# Patient Record
Sex: Female | Born: 1965 | Race: Black or African American | Hispanic: No | Marital: Married | Smoking: Former smoker
Health system: Southern US, Community
[De-identification: ages and names within clinical notes are randomized; demographics above are authoritative.]

## PROBLEM LIST (undated history)

## (undated) DIAGNOSIS — I639 Cerebral infarction, unspecified: Secondary | ICD-10-CM

## (undated) DIAGNOSIS — E785 Hyperlipidemia, unspecified: Secondary | ICD-10-CM

## (undated) DIAGNOSIS — G43919 Migraine, unspecified, intractable, without status migrainosus: Secondary | ICD-10-CM

## (undated) DIAGNOSIS — C8190 Hodgkin lymphoma, unspecified, unspecified site: Secondary | ICD-10-CM

## (undated) DIAGNOSIS — I69354 Hemiplegia and hemiparesis following cerebral infarction affecting left non-dominant side: Secondary | ICD-10-CM

## (undated) DIAGNOSIS — E119 Type 2 diabetes mellitus without complications: Secondary | ICD-10-CM

## (undated) DIAGNOSIS — H539 Unspecified visual disturbance: Secondary | ICD-10-CM

## (undated) DIAGNOSIS — E559 Vitamin D deficiency, unspecified: Secondary | ICD-10-CM

## (undated) DIAGNOSIS — M62838 Other muscle spasm: Secondary | ICD-10-CM

## (undated) DIAGNOSIS — I10 Essential (primary) hypertension: Secondary | ICD-10-CM

## (undated) DIAGNOSIS — K219 Gastro-esophageal reflux disease without esophagitis: Secondary | ICD-10-CM

## (undated) DIAGNOSIS — H548 Legal blindness, as defined in USA: Secondary | ICD-10-CM

## (undated) DIAGNOSIS — J45909 Unspecified asthma, uncomplicated: Secondary | ICD-10-CM

## (undated) DIAGNOSIS — L930 Discoid lupus erythematosus: Secondary | ICD-10-CM

## (undated) HISTORY — DX: Vitamin D deficiency, unspecified: E55.9

## (undated) HISTORY — DX: Hodgkin lymphoma, unspecified, unspecified site: C81.90

## (undated) HISTORY — DX: Unspecified visual disturbance: H53.9

## (undated) HISTORY — DX: Migraine, unspecified, intractable, without status migrainosus: G43.919

## (undated) HISTORY — DX: Discoid lupus erythematosus: L93.0

## (undated) HISTORY — DX: Legal blindness, as defined in USA: H54.8

## (undated) HISTORY — PX: MASTECTOMY: SHX3

## (undated) HISTORY — DX: Hemiplegia and hemiparesis following cerebral infarction affecting left non-dominant side: I69.354

## (undated) HISTORY — DX: Other muscle spasm: M62.838

## (undated) HISTORY — DX: Essential (primary) hypertension: I10

## (undated) HISTORY — DX: Cerebral infarction, unspecified: I63.9

## (undated) HISTORY — PX: ABDOMINAL HYSTERECTOMY: SHX81

## (undated) HISTORY — DX: Gastro-esophageal reflux disease without esophagitis: K21.9

## (undated) HISTORY — DX: Hyperlipidemia, unspecified: E78.5

---

## 1999-10-09 ENCOUNTER — Other Ambulatory Visit: Admission: RE | Admit: 1999-10-09 | Discharge: 1999-10-09 | Payer: Self-pay | Admitting: Ophthalmology

## 1999-10-15 ENCOUNTER — Encounter: Admission: RE | Admit: 1999-10-15 | Discharge: 1999-10-15 | Payer: Self-pay | Admitting: Family Medicine

## 1999-10-15 ENCOUNTER — Encounter: Payer: Self-pay | Admitting: Family Medicine

## 1999-11-05 ENCOUNTER — Encounter (HOSPITAL_BASED_OUTPATIENT_CLINIC_OR_DEPARTMENT_OTHER): Payer: Self-pay | Admitting: General Surgery

## 1999-11-07 ENCOUNTER — Inpatient Hospital Stay (HOSPITAL_COMMUNITY): Admission: RE | Admit: 1999-11-07 | Discharge: 1999-11-10 | Payer: Self-pay | Admitting: General Surgery

## 1999-11-07 ENCOUNTER — Encounter (HOSPITAL_BASED_OUTPATIENT_CLINIC_OR_DEPARTMENT_OTHER): Payer: Self-pay | Admitting: General Surgery

## 1999-11-07 ENCOUNTER — Encounter (INDEPENDENT_AMBULATORY_CARE_PROVIDER_SITE_OTHER): Payer: Self-pay

## 1999-11-09 ENCOUNTER — Encounter: Payer: Self-pay | Admitting: Pediatrics

## 2000-02-04 ENCOUNTER — Emergency Department (HOSPITAL_COMMUNITY): Admission: EM | Admit: 2000-02-04 | Discharge: 2000-02-05 | Payer: Self-pay | Admitting: *Deleted

## 2000-02-21 ENCOUNTER — Emergency Department (HOSPITAL_COMMUNITY): Admission: EM | Admit: 2000-02-21 | Discharge: 2000-02-21 | Payer: Self-pay | Admitting: Emergency Medicine

## 2000-02-22 ENCOUNTER — Encounter: Payer: Self-pay | Admitting: Emergency Medicine

## 2000-02-23 ENCOUNTER — Emergency Department (HOSPITAL_COMMUNITY): Admission: EM | Admit: 2000-02-23 | Discharge: 2000-02-23 | Payer: Self-pay | Admitting: Emergency Medicine

## 2000-02-25 ENCOUNTER — Encounter: Admission: RE | Admit: 2000-02-25 | Discharge: 2000-02-25 | Payer: Self-pay | Admitting: Psychiatry

## 2000-03-26 ENCOUNTER — Ambulatory Visit (HOSPITAL_COMMUNITY): Admission: RE | Admit: 2000-03-26 | Discharge: 2000-03-26 | Payer: Self-pay | Admitting: Family Medicine

## 2000-03-26 ENCOUNTER — Encounter: Payer: Self-pay | Admitting: Family Medicine

## 2000-05-18 ENCOUNTER — Encounter: Admission: RE | Admit: 2000-05-18 | Discharge: 2000-05-18 | Payer: Self-pay | Admitting: Internal Medicine

## 2000-05-22 ENCOUNTER — Emergency Department (HOSPITAL_COMMUNITY): Admission: EM | Admit: 2000-05-22 | Discharge: 2000-05-22 | Payer: Self-pay | Admitting: Emergency Medicine

## 2000-06-21 ENCOUNTER — Ambulatory Visit (HOSPITAL_COMMUNITY): Admission: RE | Admit: 2000-06-21 | Discharge: 2000-06-21 | Payer: Self-pay | Admitting: Internal Medicine

## 2000-06-21 ENCOUNTER — Encounter: Admission: RE | Admit: 2000-06-21 | Discharge: 2000-06-21 | Payer: Self-pay | Admitting: Internal Medicine

## 2000-06-29 ENCOUNTER — Ambulatory Visit (HOSPITAL_COMMUNITY): Admission: RE | Admit: 2000-06-29 | Discharge: 2000-06-29 | Payer: Self-pay | Admitting: *Deleted

## 2000-06-29 ENCOUNTER — Encounter: Payer: Self-pay | Admitting: *Deleted

## 2000-06-30 ENCOUNTER — Ambulatory Visit (HOSPITAL_COMMUNITY): Admission: RE | Admit: 2000-06-30 | Discharge: 2000-06-30 | Payer: Self-pay | Admitting: Internal Medicine

## 2000-06-30 ENCOUNTER — Encounter: Admission: RE | Admit: 2000-06-30 | Discharge: 2000-06-30 | Payer: Self-pay | Admitting: Internal Medicine

## 2000-07-23 ENCOUNTER — Encounter: Admission: RE | Admit: 2000-07-23 | Discharge: 2000-07-23 | Payer: Self-pay

## 2000-08-02 ENCOUNTER — Encounter: Admission: RE | Admit: 2000-08-02 | Discharge: 2000-10-31 | Payer: Self-pay | Admitting: *Deleted

## 2000-09-07 ENCOUNTER — Emergency Department (HOSPITAL_COMMUNITY): Admission: EM | Admit: 2000-09-07 | Discharge: 2000-09-08 | Payer: Self-pay | Admitting: Emergency Medicine

## 2000-09-07 ENCOUNTER — Encounter: Payer: Self-pay | Admitting: Emergency Medicine

## 2000-09-10 ENCOUNTER — Encounter: Payer: Self-pay | Admitting: Emergency Medicine

## 2000-09-10 ENCOUNTER — Emergency Department (HOSPITAL_COMMUNITY): Admission: EM | Admit: 2000-09-10 | Discharge: 2000-09-10 | Payer: Self-pay | Admitting: Emergency Medicine

## 2000-09-13 ENCOUNTER — Encounter: Admission: RE | Admit: 2000-09-13 | Discharge: 2000-09-13 | Payer: Self-pay | Admitting: Internal Medicine

## 2000-09-21 ENCOUNTER — Encounter: Admission: RE | Admit: 2000-09-21 | Discharge: 2000-09-21 | Payer: Self-pay | Admitting: Hematology and Oncology

## 2000-10-01 ENCOUNTER — Encounter: Payer: Self-pay | Admitting: *Deleted

## 2000-10-01 ENCOUNTER — Ambulatory Visit (HOSPITAL_COMMUNITY): Admission: RE | Admit: 2000-10-01 | Discharge: 2000-10-01 | Payer: Self-pay | Admitting: *Deleted

## 2001-07-21 ENCOUNTER — Encounter: Admission: RE | Admit: 2001-07-21 | Discharge: 2001-07-21 | Payer: Self-pay | Admitting: Internal Medicine

## 2001-08-03 ENCOUNTER — Encounter (INDEPENDENT_AMBULATORY_CARE_PROVIDER_SITE_OTHER): Payer: Self-pay | Admitting: *Deleted

## 2001-08-03 ENCOUNTER — Other Ambulatory Visit: Admission: RE | Admit: 2001-08-03 | Discharge: 2001-08-03 | Payer: Self-pay | Admitting: *Deleted

## 2001-08-03 ENCOUNTER — Encounter: Admission: RE | Admit: 2001-08-03 | Discharge: 2001-08-03 | Payer: Self-pay | Admitting: *Deleted

## 2001-08-18 ENCOUNTER — Encounter: Admission: RE | Admit: 2001-08-18 | Discharge: 2001-08-18 | Payer: Self-pay | Admitting: *Deleted

## 2001-08-18 ENCOUNTER — Encounter (INDEPENDENT_AMBULATORY_CARE_PROVIDER_SITE_OTHER): Payer: Self-pay | Admitting: *Deleted

## 2001-08-19 ENCOUNTER — Inpatient Hospital Stay (HOSPITAL_COMMUNITY): Admission: RE | Admit: 2001-08-19 | Discharge: 2001-08-20 | Payer: Self-pay | Admitting: *Deleted

## 2001-08-25 ENCOUNTER — Emergency Department (HOSPITAL_COMMUNITY): Admission: EM | Admit: 2001-08-25 | Discharge: 2001-08-25 | Payer: Self-pay | Admitting: Emergency Medicine

## 2001-09-05 ENCOUNTER — Emergency Department (HOSPITAL_COMMUNITY): Admission: EM | Admit: 2001-09-05 | Discharge: 2001-09-05 | Payer: Self-pay | Admitting: Emergency Medicine

## 2001-09-07 ENCOUNTER — Encounter (INDEPENDENT_AMBULATORY_CARE_PROVIDER_SITE_OTHER): Payer: Self-pay | Admitting: *Deleted

## 2001-09-07 ENCOUNTER — Observation Stay (HOSPITAL_COMMUNITY): Admission: AD | Admit: 2001-09-07 | Discharge: 2001-09-08 | Payer: Self-pay | Admitting: *Deleted

## 2001-09-16 ENCOUNTER — Inpatient Hospital Stay (HOSPITAL_COMMUNITY): Admission: AD | Admit: 2001-09-16 | Discharge: 2001-10-07 | Payer: Self-pay | Admitting: Surgery

## 2001-09-28 ENCOUNTER — Encounter: Payer: Self-pay | Admitting: Infectious Diseases

## 2001-10-25 ENCOUNTER — Ambulatory Visit (HOSPITAL_COMMUNITY): Admission: RE | Admit: 2001-10-25 | Discharge: 2001-10-25 | Payer: Self-pay | Admitting: *Deleted

## 2003-09-14 ENCOUNTER — Other Ambulatory Visit: Admission: RE | Admit: 2003-09-14 | Discharge: 2003-09-14 | Payer: Self-pay | Admitting: Family Medicine

## 2004-09-01 ENCOUNTER — Inpatient Hospital Stay (HOSPITAL_COMMUNITY): Admission: EM | Admit: 2004-09-01 | Discharge: 2004-09-05 | Payer: Self-pay | Admitting: Emergency Medicine

## 2004-09-09 ENCOUNTER — Emergency Department (HOSPITAL_COMMUNITY): Admission: EM | Admit: 2004-09-09 | Discharge: 2004-09-09 | Payer: Self-pay | Admitting: Family Medicine

## 2004-09-26 ENCOUNTER — Emergency Department (HOSPITAL_COMMUNITY): Admission: EM | Admit: 2004-09-26 | Discharge: 2004-09-26 | Payer: Self-pay | Admitting: Emergency Medicine

## 2005-09-09 ENCOUNTER — Emergency Department (HOSPITAL_COMMUNITY): Admission: EM | Admit: 2005-09-09 | Discharge: 2005-09-09 | Payer: Self-pay | Admitting: Emergency Medicine

## 2005-12-14 ENCOUNTER — Ambulatory Visit (HOSPITAL_COMMUNITY): Admission: RE | Admit: 2005-12-14 | Discharge: 2005-12-14 | Payer: Self-pay | Admitting: *Deleted

## 2006-04-10 ENCOUNTER — Emergency Department (HOSPITAL_COMMUNITY): Admission: EM | Admit: 2006-04-10 | Discharge: 2006-04-10 | Payer: Self-pay | Admitting: Family Medicine

## 2006-05-29 ENCOUNTER — Ambulatory Visit: Payer: Self-pay | Admitting: Oncology

## 2006-07-05 LAB — CBC WITH DIFFERENTIAL/PLATELET
BASO%: 0.7 % (ref 0.0–2.0)
Basophils Absolute: 0.1 10*3/uL (ref 0.0–0.1)
EOS%: 0.7 % (ref 0.0–7.0)
Eosinophils Absolute: 0.1 10*3/uL (ref 0.0–0.5)
HCT: 39.8 % (ref 34.8–46.6)
HGB: 13 g/dL (ref 11.6–15.9)
LYMPH%: 30.4 % (ref 14.0–48.0)
MCH: 26.2 pg (ref 26.0–34.0)
MCHC: 32.6 g/dL (ref 32.0–36.0)
MCV: 80.5 fL — ABNORMAL LOW (ref 81.0–101.0)
MONO#: 0.6 10*3/uL (ref 0.1–0.9)
MONO%: 6.3 % (ref 0.0–13.0)
NEUT#: 5.9 10*3/uL (ref 1.5–6.5)
NEUT%: 61.9 % (ref 39.6–76.8)
Platelets: 373 10*3/uL (ref 145–400)
RBC: 4.94 10*6/uL (ref 3.70–5.32)
RDW: 15.9 % — ABNORMAL HIGH (ref 11.3–14.5)
WBC: 9.5 10*3/uL (ref 3.9–10.0)
lymph#: 2.9 10*3/uL (ref 0.9–3.3)

## 2006-07-05 LAB — COMPREHENSIVE METABOLIC PANEL
ALT: <8 U/L (ref 0–35)
AST: 7 U/L (ref 0–37)
Albumin: 3.9 g/dL (ref 3.5–5.2)
Alkaline Phosphatase: 73 U/L (ref 39–117)
BUN: 15 mg/dL (ref 6–23)
CO2: 23 mEq/L (ref 19–32)
Calcium: 9.3 mg/dL (ref 8.4–10.5)
Chloride: 105 mEq/L (ref 96–112)
Creatinine, Ser: 0.9 mg/dL (ref 0.40–1.20)
Glucose, Bld: 104 mg/dL — ABNORMAL HIGH (ref 70–99)
Potassium: 4 mEq/L (ref 3.5–5.3)
Sodium: 141 mEq/L (ref 135–145)
Total Bilirubin: 0.3 mg/dL (ref 0.3–1.2)
Total Protein: 7.2 g/dL (ref 6.0–8.3)

## 2006-07-05 LAB — LACTATE DEHYDROGENASE: LDH: 104 U/L (ref 94–250)

## 2006-07-06 LAB — TSH: TSH: 1.555 u[IU]/mL (ref 0.350–5.500)

## 2006-07-06 LAB — T4, FREE: Free T4: 0.95 ng/dL (ref 0.89–1.80)

## 2006-08-06 ENCOUNTER — Ambulatory Visit (HOSPITAL_COMMUNITY): Admission: RE | Admit: 2006-08-06 | Discharge: 2006-08-07 | Payer: Self-pay | Admitting: Orthopedic Surgery

## 2006-08-13 ENCOUNTER — Ambulatory Visit: Payer: Self-pay | Admitting: Oncology

## 2007-03-23 ENCOUNTER — Encounter: Admission: RE | Admit: 2007-03-23 | Discharge: 2007-03-23 | Payer: Self-pay | Admitting: Orthopedic Surgery

## 2007-07-14 ENCOUNTER — Encounter: Admission: RE | Admit: 2007-07-14 | Discharge: 2007-07-14 | Payer: Self-pay | Admitting: Family Medicine

## 2007-07-27 ENCOUNTER — Encounter: Admission: RE | Admit: 2007-07-27 | Discharge: 2007-07-27 | Payer: Self-pay | Admitting: Family Medicine

## 2007-07-27 ENCOUNTER — Encounter (INDEPENDENT_AMBULATORY_CARE_PROVIDER_SITE_OTHER): Payer: Self-pay | Admitting: Family Medicine

## 2007-08-29 ENCOUNTER — Ambulatory Visit (HOSPITAL_COMMUNITY): Admission: RE | Admit: 2007-08-29 | Discharge: 2007-08-30 | Payer: Self-pay | Admitting: Orthopedic Surgery

## 2007-11-04 ENCOUNTER — Inpatient Hospital Stay (HOSPITAL_COMMUNITY): Admission: RE | Admit: 2007-11-04 | Discharge: 2007-11-07 | Payer: Self-pay | Admitting: Orthopedic Surgery

## 2008-01-19 DIAGNOSIS — K219 Gastro-esophageal reflux disease without esophagitis: Secondary | ICD-10-CM | POA: Insufficient documentation

## 2008-03-17 IMAGING — CR DG CHEST 2V
2 series · 2 of 2 positions shown · non-contrast
Comparison: Chest, 08/04/06 and chest CT, December 2005.

CLINICAL DATA: Preadmission.  Frozen right shoulder.
 CHEST - 2 VIEW - 08/24/07:

[view not recorded (1 of 2)]
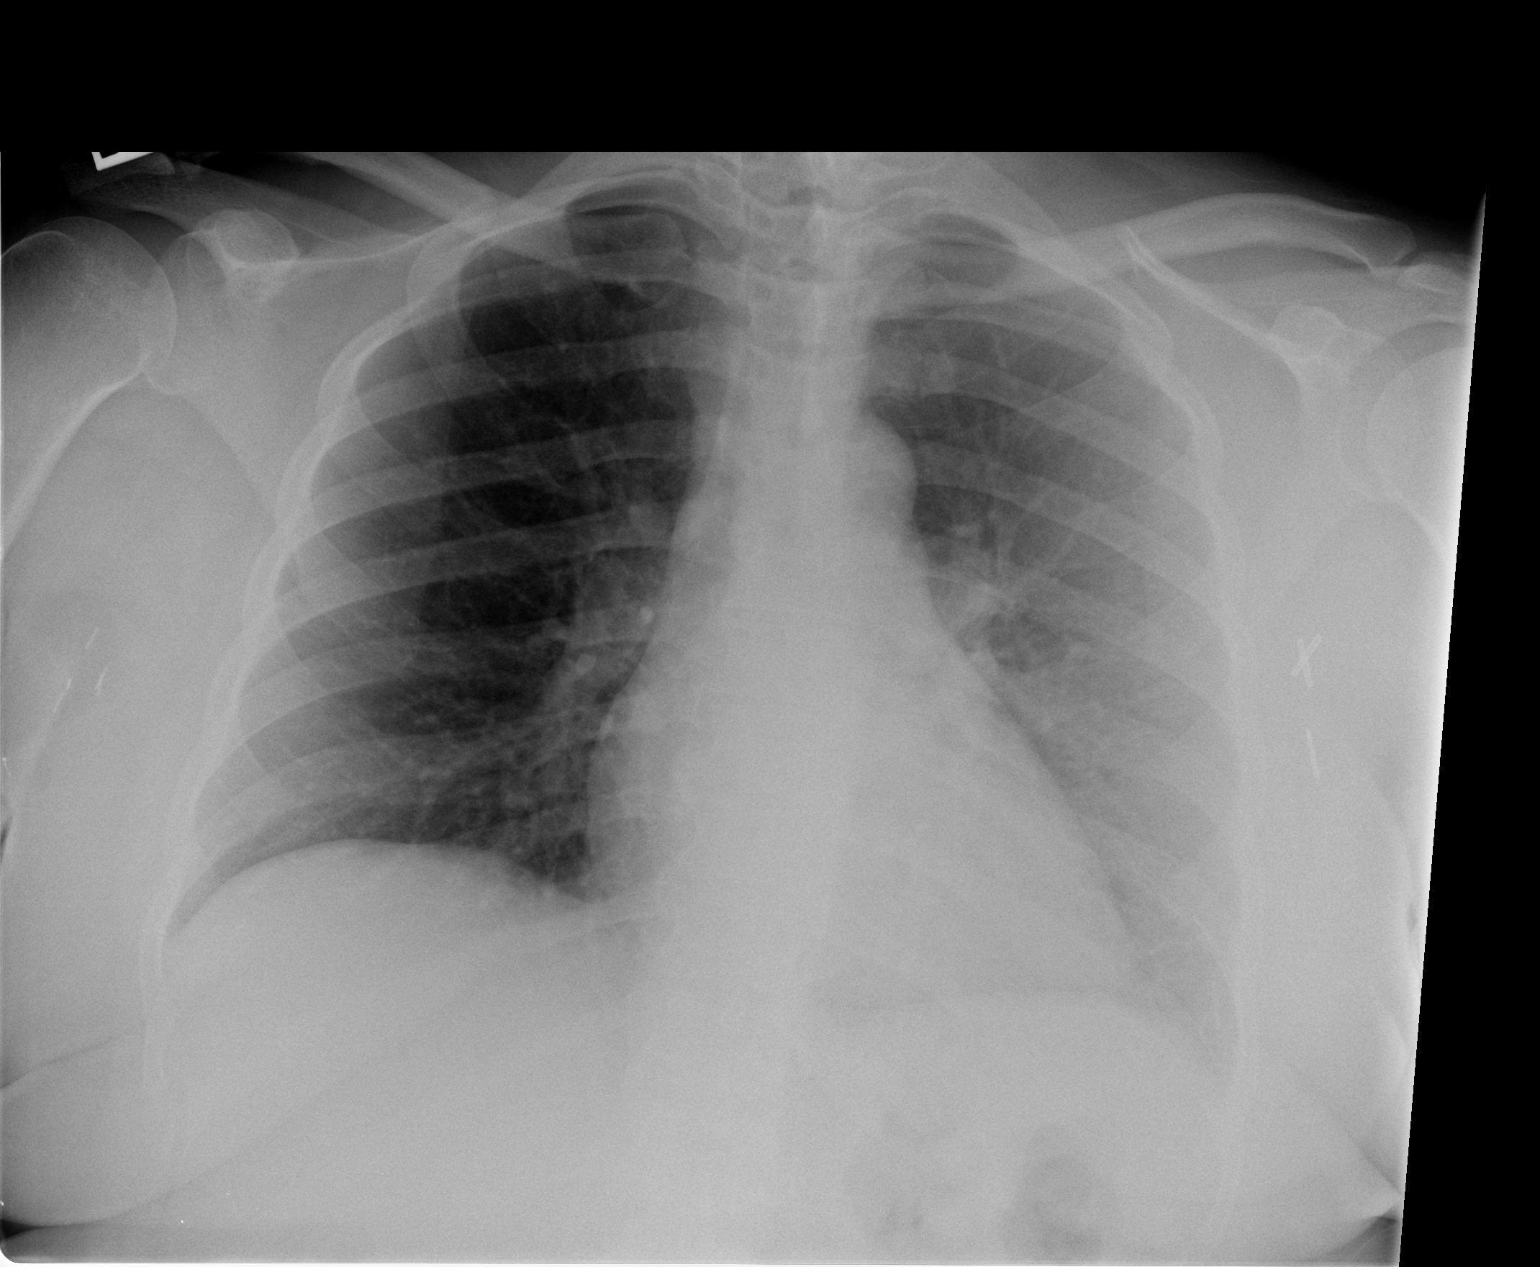

[view not recorded (2 of 2)]
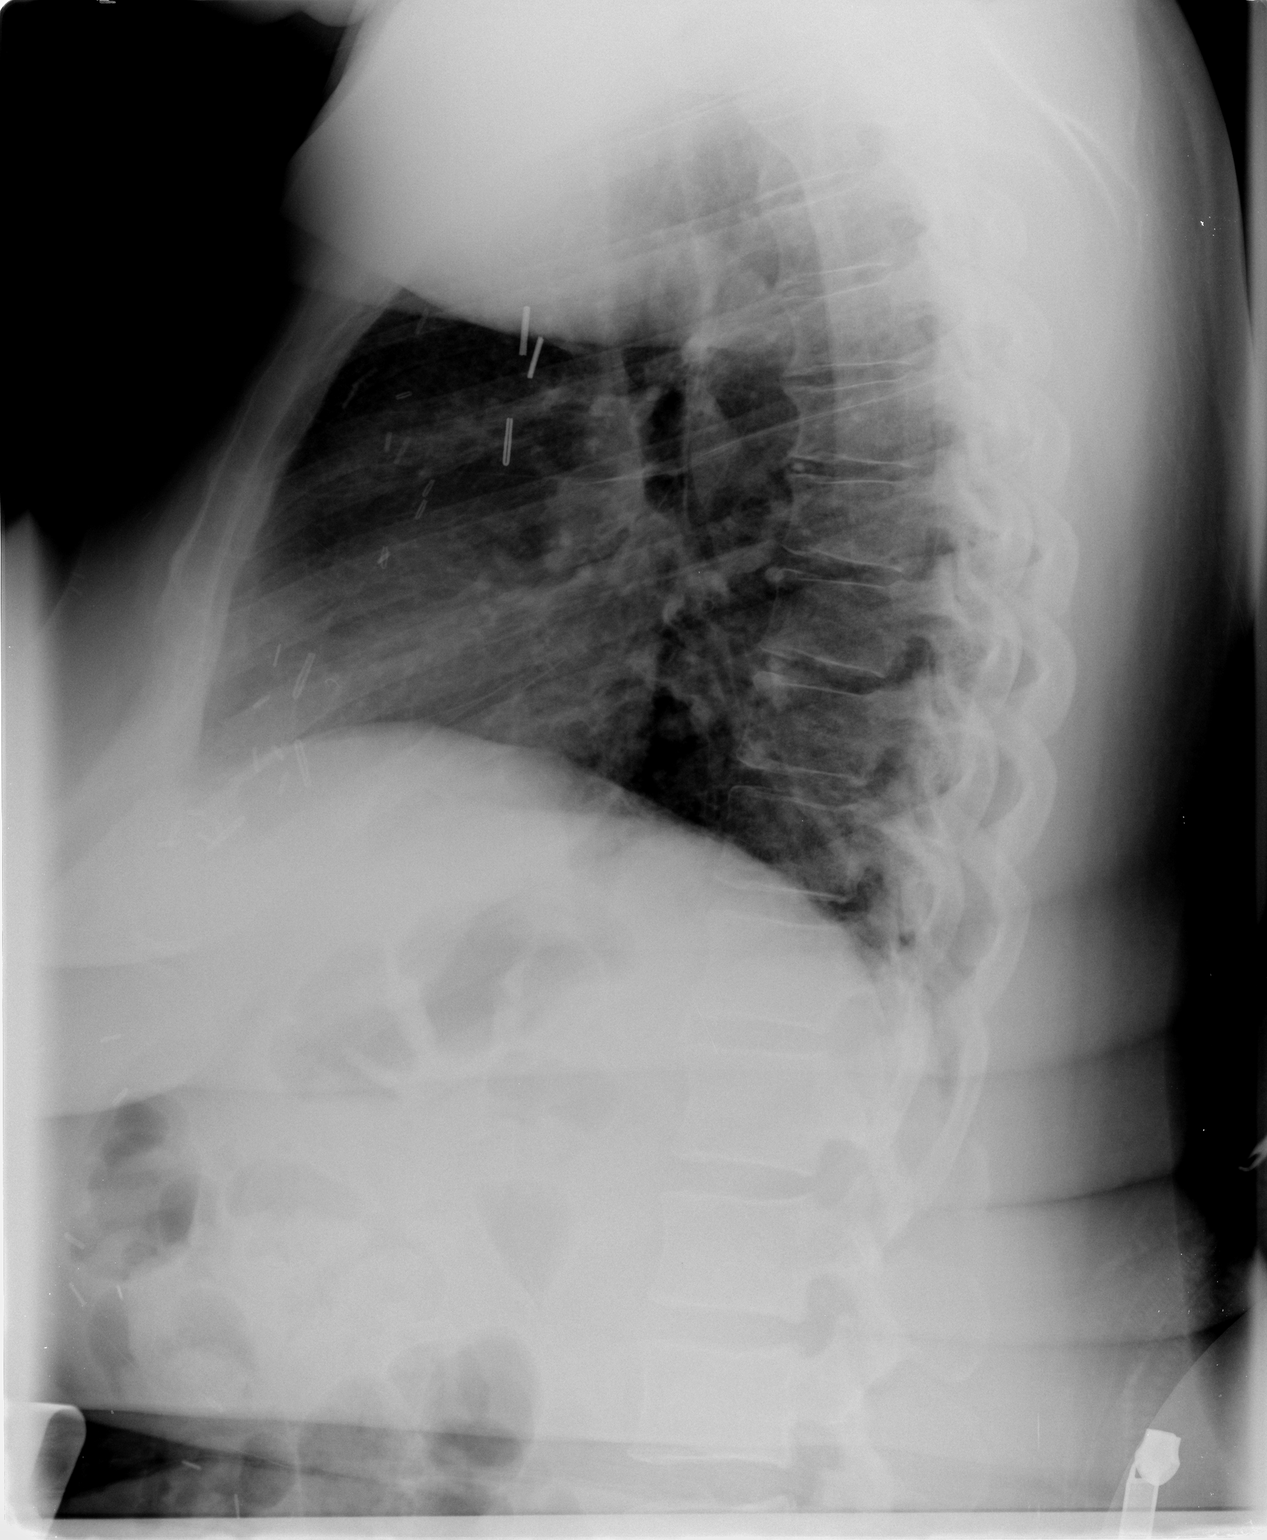

[2 of 2 positions shown; findings below may reference images not displayed]

FINDINGS: The heart and mediastinal contours are stable, within normal limits.  Surgical clips project over the anterior chest wall consistent with the history of prior TRAM flap reconstruction.  Negative for edema, focal air space opacity, and effusion.
IMPRESSION: 1.  No evidence of acute cardiopulmonary disease.  
 2.  Bilateral TRAM flaps.

## 2008-05-31 ENCOUNTER — Encounter: Admission: RE | Admit: 2008-05-31 | Discharge: 2008-05-31 | Payer: Self-pay | Admitting: Family Medicine

## 2008-06-20 DIAGNOSIS — G43909 Migraine, unspecified, not intractable, without status migrainosus: Secondary | ICD-10-CM | POA: Insufficient documentation

## 2008-09-24 DIAGNOSIS — J4520 Mild intermittent asthma, uncomplicated: Secondary | ICD-10-CM | POA: Insufficient documentation

## 2008-09-28 DIAGNOSIS — J309 Allergic rhinitis, unspecified: Secondary | ICD-10-CM | POA: Insufficient documentation

## 2009-03-08 DIAGNOSIS — E559 Vitamin D deficiency, unspecified: Secondary | ICD-10-CM | POA: Insufficient documentation

## 2010-07-07 ENCOUNTER — Encounter: Payer: Self-pay | Admitting: Gastroenterology

## 2010-07-07 ENCOUNTER — Encounter: Payer: Self-pay | Admitting: Family Medicine

## 2010-10-28 NOTE — Discharge Summary (Signed)
NAME:  Mary Wilson, Mary Wilson           ACCOUNT NO.:  [id number]   MEDICAL RECORD NO.:  [id number]          PATIENT TYPE:  OIB   LOCATION:  5013                         FACILITY:  [facility]   PHYSICIAN:  Maisie Fus B. Dixon, P.A.  DATE OF BIRTH:  1965/06/18   DATE OF ADMISSION:  08/29/2007  DATE OF DISCHARGE:  08/30/2007                               DISCHARGE SUMMARY   ADMISSION DIAGNOSIS:  Right shoulder pain secondary to adhesive  capsulitis.   DISCHARGE DIAGNOSIS:  Right shoulder pain secondary to adhesive  capsulitis.   PROCEDURE:  The patient had a right shoulder arthroscopy, lysis of  adhesions and biceps tenotomy performed by Dr. Malon Kindle on August 29, 2007.  Assistant was [employer], PA-C.  General anesthesia was  used with an interscalene block and no complications.   HOSPITAL COURSE:  The patient was admitted on August 29, 2007, for the  above-stated procedure, which she tolerated well.  After adequate time  in the post anesthesia care unit, she was transferred up to 5000.  Postop day #1 the patient complained about moderate pain in the right  shoulder but she was neurovascularly intact.  Sling was in place and  dressing was intact.  Capillary fill was less than 2 seconds.  The  patient did work with occupational and physical therapy before discharge  from the hospital.   DISCHARGE PLAN:  The patient will be discharged home after therapy on  August 30, 2007.   FOLLOW-UP:  The patient will follow back up with Dr. Malon Kindle in 7-  10 days.   CONDITION:  Stable.   DIET:  Regular.   The patient has allergies to PENICILLINS, ERYTHROMYCIN, SULFA, and  TETRACYCLINE.   DISCHARGE MEDICATIONS:  1. Norvasc 5 mg p.o. nightly.  2. Robaxin 500 mg p.o. q.6 h.  3. Flonase 2 sprays each nostril daily.  4. Advair 1 puff inhaled nightly.  5. Avapro 150 mg p.o. daily.  6. Lamictal 100 mg p.o. b.i.d.  7. Claritin 10 mg p.o. daily.  8. Lopressor 100 mg p.o. nightly and also 50 mg  p.o. daily.  9. Protonix 80 mg p.o. nightly.  10.Rozerem 8 mg p.o. nightly.  11.Zocor 20 mg p.o. nightly.  12.Topamax 150 p.o. b.i.d.  13.Albuterol 2.5 mg inhaled q.8 h.      Thomas B. Ferne Coe.     TBD/MEDQ  D:  08/30/2007  T:  08/30/2007  Job:  [id number]

## 2010-10-28 NOTE — Op Note (Signed)
NAME:  Mary Wilson, Mary Wilson           ACCOUNT NO.:  [id number]   MEDICAL RECORD NO.:  [id number]            PATIENT TYPE:   LOCATION:                                 FACILITY:   PHYSICIAN:  Almedia Balls. Ranell Patrick, M.D.      DATE OF BIRTH:   DATE OF PROCEDURE:  DATE OF DISCHARGE:                               OPERATIVE REPORT   PREOPERATIVE DIAGNOSIS:  Right shoulder adhesive capsulitis following  shoulder surgery.   POSTOPERATIVE DIAGNOSIS:  Right shoulder adhesive capsulitis following  shoulder surgery.   PROCEDURE PERFORMED:  Right shoulder examination under anesthesia,  manipulation under anesthesia, shoulder arthroscopy with extensive intra-  articular debridement including arthroscopic capsular release.   ATTENDING SURGEON:  Almedia Balls. Ranell Patrick, M.D.   ASSISTANT:  Donnie Coffin. Dixon, PA-C.   ANESTHESIA:  General anesthesia was used plus interscalene block.   ESTIMATED BLOOD LOSS:  Minimal.   FLUID REPLACEMENT:  1500 mL crystalloid.   INSTRUMENT COUNTS:  Correct.   COMPLICATIONS:  There were no complications.   Preoperative antibiotics were given.   INDICATIONS:  The patient is a 45 year old female with status post  shoulder arthroscopy with postoperative stiffness.  The patient has had  a failure to progress with the therapy and presents now for operative  capsule release.  Informed consent was obtained.   DESCRIPTION OF PROCEDURE:  After an adequate level of anesthesia was  achieved, the patient was positioned in modified beach-chair position.  All neurovascular structures were padded appropriately.  Preoperative  exam under anesthesia demonstrated port elevation about 80 degrees,  abduction 50 degrees, external rotation 10 degrees, and internal  rotation 20 degrees.  Following manipulation under anesthesia with full  forward elevation, we had cross-arm adduction and internal rotation,  which was improved and external rotation is up to about 60 degrees.  We  then sterilely  prepped and draped the right shoulder in the usual  manner.  We entered the shoulder through arthroscopically using standard  arthroscopic portals.  Anterior, lateral, and posterior portals were  created in the usual manner.  Identified significant hyperemia and  scarring within the joint, performed capsule release and rotator  interval release.  Subscapularis, rotator cuff all appeared normal, and  articular cartilage appeared normal.  Superior labrum had some fraying.  We performed some debridement of this free edges and posteriorly.  Complete release of the capsule was performed with basket forceps and an  ArthroCare unit.  We then placed the scope into the  subacromial space, performed a thorough release of subdeltoid adhesions.  Cuff appeared normal from the bursal surface.  Following the surgery, we  concluded the surgery with suturing the wounds of 4-0 Monocryl followed  by Steri-Strips and sterile compressing dressing.  The patient tolerated  the procedure well.      Almedia Balls. Ranell Patrick, M.D.  Electronically Signed     SRN/MEDQ  D:  11/04/2007  T:  11/05/2007  Job:  [id number]

## 2010-10-28 NOTE — Op Note (Signed)
NAME:  Mary Wilson, Mary Wilson           ACCOUNT NO.:  [id number]   MEDICAL RECORD NO.:  [id number]          PATIENT TYPE:  OIB   LOCATION:  5013                         FACILITY:  [facility]   PHYSICIAN:  Almedia Balls. Ranell Patrick, M.D. DATE OF BIRTH:  1965/09/20   DATE OF PROCEDURE:  08/29/2007  DATE OF DISCHARGE:                               OPERATIVE REPORT   PREOPERATIVE DIAGNOSIS:  Right shoulder pain, secondary to a frozen  shoulder.   POSTOPERATIVE DIAGNOSES:  1. Right shoulder -  frozen shoulder.  2. Superior labral tear, anterior and posterior.   PROCEDURE:  Right shoulder arthroscopy with extensive intraarticular  debridement, including arthroscopic capsular release, arthroscopic  biceps tenotomy, arthroscopic subacromial decompression.   SURGEON:  Dr. Almedia Balls. Norris.   ASSISTANT:  Donnie Coffin. Dixon, P.A.-C.   ANESTHESIA:  General anesthesia was used.   FLUIDS REPLACED:  Was 800 mL of crystalloid.   ESTIMATED BLOOD LOSS:  Minimal.   INDICATIONS FOR PROCEDURE:  The patient is a 45 year old female with a  history of worsening right shoulder pain, secondary to a suspected  frozen shoulder.  The patient presents after failed attempts at  conservative management, desiring operative treatment for function and  to eliminate pain.  An informed consent was obtained.   DESCRIPTION OF PROCEDURE:  After an adequate level of anesthesia was  achieved, the patient was positioned in the modified beach chair  position.  All neurovascular structures were padded appropriately.  The  right shoulder was examined under anesthesia.  The patient had  significant stiffness in the shoulder.  Forward elevation was limited to  70 degrees abduction, 45 degrees external rotation internal rotation  about 20 degrees.  I performed a manipulation under anesthesia, gaining  excellent improvement in forward elevation up to 180 degrees.  Crossed  arm abduction and internal rotation resulted in some pops.  External  rotation was manipulated up to about 65 to 70 degrees.  After this we  went ahead and arthroscopically entered the shoulder after a sterile  prep and drape.  We used standard portals including posterior, anterior  and lateral portals.  We identified significant capsulitis within the  shoulder and performed a capsular release, including rotator interval  release and skeletonization of the subscapularis.  The subscapularis was  noted to be intact.  The superior labral area was torn and there was  hypermobility present.  The biceps was in good condition, but because of  the concern over persistent source for pain, we tenotomized the biceps.  We inspected the cuff both from the anterior and the posterior portal  and it was noted to be intact.  We completed the capsular release  posteriorly as well, using basket forceps down in the inferior pouch, to  avoid thermal injury to the axillary nerve.  We placed the scope in the  subacromial space and performed a bursectomy and a limited  acromioplasty to fully visualize the rotator cuff which was noted to be  intact on the bursal side, including suturing the wound with #4-0  Monocryl, followed by Steri-Strips and a sterile dressing.   The patient tolerated the surgery well.  Almedia Balls. Ranell Patrick, M.D.  Electronically Signed     SRN/MEDQ  D:  08/29/2007  T:  08/29/2007  Job:  [id number]

## 2010-10-31 NOTE — Discharge Summary (Signed)
. [facility]  Patient:    Mary Wilson, LONG Visit Number: [id number] MRN: [id number]          Service Type: MED Location: 5700 [id number] Attending Physician:  Andre Lefort Dictated by:   Vikki Ports, M.D. Admit Date:  09/16/2001 Discharge Date: 10/07/2001                             Discharge Summary  ADMISSION DIAGNOSIS:  Status post right mastectomy with wound breakdown and wound infection.  DISCHARGE DIAGNOSIS:  Status post right mastectomy with wound breakdown and wound infection, methicillin resistant Staphylococcus aureus.  CONSULTANTS:  Teena Irani. Odis Luster, M.D.  BRIEF HISTORY OF PRESENT ILLNESS:  The patient is a 45 year old black female with a long history of Hodgkins lymphoma as well as extensive DCIS of the right breast.  The patient had undergone right total mastectomy; however, with dressing changes the patient continued to have significant drainage and low-grade fevers.  For this reason she is admitted for cultures and antibiotic therapy.  HOSPITAL COURSE:  Local wound care was started using physical therapy and hydrotherapy.  Cultures grew out MRSA.  Dr. Odis Luster saw the patient and a VAC was applied.  Infectious disease then was consulted because of the MRSA in the wound and they recommended 21 days of IV vancomycin.  The patient had significant pain issues requiring the use of PCA Dilaudid, PCA morphine, fentanyl patch and p.o. Vicodin.  She continued to have aggressive wound therapy and maintained on vancomycin.  VAC was removed after a five-day course, but the patient continued to have significant exudate.  The patient remained in the hospital for a total of 20 days and had received her 14th day of vancomycin when her pain was being controlled with p.o. pain pills; and, home health was arranged for home vancomycin.  The patient was discharged home with continued wet-to-dry dressings; and, follow up with  Dr. Odis Luster two weeks after discharge and with me one week after discharge.  DISPOSITION:  Discharged to home.  CONDITION ON DISCHARGE:  Good and improved.  MEDICATIONS: 1. Vancomycin 1 gram q12. hours. 2. Vicodin 1-2 p.o. q4. hour p.r.n. pain. Dictated by:   Vikki Ports, M.D. Attending Physician:  Andre Lefort DD:  11/01/01 TD:  11/02/01 Job: [id number]

## 2010-10-31 NOTE — Discharge Summary (Signed)
Mary Wilson, Mary Wilson           ACCOUNT NO.:  [id number]   MEDICAL RECORD NO.:  [id number]          PATIENT TYPE:  INP   LOCATION:  2906                         FACILITY:  [facility]   PHYSICIAN:  Jackie Plum, M.D.DATE OF BIRTH:  02-Dec-1965   DATE OF ADMISSION:  08/31/2004  DATE OF DISCHARGE:                                 DISCHARGE SUMMARY   DISCHARGE DIAGNOSES:  1.  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      1.  MRI negative for any acute changes, indicated empty sella.      2.  Electroencephalogram was unremarkable.  2.  History of Hodgkin's lymphoma.  3.  History of mastectomy for ductal carcinoma in situ.  4.  Right eye enucleation.   DISCHARGE MEDICATIONS:  Patient is to resume all her preadmission  medications as previously which include:  Topamax, Lamictal, Nexium, Valium and Kay-Ciel.   DISCHARGE LABORATORY DATA:  Sodium 140, potassium 3.6, chloride 109, glucose  89, BUN 10, creatinine 1.0. Prolactin level 19.7.  The white blood cell  count was 10.7, hemoglobin 11.6, hematocrit 34.5, MCV 78.8, platelet count  346,000.   CONSULTATIONS:  Dr. Marcelino Freestone and Pramod P. Pearlean Brownie, MD, Neurology.   PROCEDURE:  MRI, electroencephalogram as noted above.   CONDITION ON DISCHARGE:  Improved and stable.   REASON FOR ADMISSION:  Multiple seizures.   HISTORY OF PRESENT ILLNESS:  The patient was brought to the emergency room  by her family on account of increasing frequency of seizures over two week  period prior to admission.  The patient had presented to [facility] emergency room  one week prior to admission for seizures whereupon she had a CT scan and was  discharged from the emergency room.  She came to [facility] emergency  department on account of recurrence of her seizures.  According to history  and physical by Dr. Lendell Caprice on admission the patient's temperature was 98.2  degrees F, pulse rate of 136 which had come down to 100 per minute,  respiratory rate of 16 and saturating 100% on room air.  On her neurological  testing she was said to be alert and oriented, her speech was slow but  coherent. She had a motor of 4/5 on right, flaccid on the left.  Her right  leg strength was 4/5 and her 0/5 on left.  Her Babinski sign was negative.  Her lungs were clear to auscultation.  Cardiac examination was unremarkable.  Her white count was 12.8 with hemoglobin of 11.8, hematocrit 35.3, platelet  count 348,000. Urinalysis was negative for any urinary tract infection.  Chemistries indicated glucose 89, BUN 10, calcium 3.6, normal sodium.  Electrocardiogram showed sinus tachycardia with non specific ST-T wave  changes.  She was therefore admitted for further evaluation.   HOSPITAL COURSE:  The patient was admitted to a step-down bed.  She was  monitored in step-down where she had reported episodes of seizures by  nursing.  She had an MRI and electroencephalogram as indicated above.  We  consulted neurology who recommended transfer to her primary care physician  for possible admission at [facility] epilepsy center  for long term epilepsy  monitoring.  I have been calling her primary care physician over the last 48  hours without any success and, in fact,  [name], our case management also  called and left a message, however, as of today, September 05, 2004, at 9:00  A.M., nobody has contacted.  The patient was discussed with Dr. Pearlean Brownie  yesterday and plans for her to be off her medications for this monitoring.  On rounds today the patient feels fine.  She does not have any fever or  chills.  She has not had any seizure episodes overnight, however, she is  tachycardic about 140 per minute.  She is a little dry.  We are going to  give her some fluids over the next few hours and see how she does and  possibly discharge her home some time this afternoon or tomorrow morning  latest after her fluid management.  I spent some time discussing patient's  plan  of care with the patient and her husband and expressed understanding  that if the patient has any further episodes of seizures she is supposed to  report to her doctor or to the emergency room here at [facility] or  emergency room at [facility].  All of their questions were answered and the plan  of care was discussed satisfactorily.      GO/MEDQ  D:  09/05/2004  T:  09/05/2004  Job:  [id number]   cc:   Pramod P. Pearlean Brownie, MD  Fax: [phone]   Gustavus Messing. Orlin Hilding, M.D.  [address]  [address]  [place]  [place] [address]  Fax: [phone]   Dr. Joycie Peek, [facility]

## 2010-10-31 NOTE — Op Note (Signed)
[place]  Patient:    Mary Wilson, BOWN Visit Number: [id number] MRN: [id number]          Service Type: DSU Location: 3100 [id number] Attending Physician:  Vikki Ports. Dictated by:   Earna Coder, M.D. Proc. Date: 08/18/01 Admit Date:  08/18/2001                             Operative Report  PREOPERATIVE DIAGNOSIS:  Extensive right breast papillomatosis.  POSTOPERATIVE DIAGNOSIS:  Extensive right breast papillomatosis.  OPERATION PERFORMED:  Right breast partial mastectomy with needle localization and bracketing.  SURGEON:  Earna Coder, M.D.  ANESTHESIA:  General.  DESCRIPTION OF PROCEDURE:  The patient was taken to the operating room, placed in the supine position.  After adequate general anesthesia was induced, the right breast was prepped and draped in normal sterile fashion.  The previously placed wires were cut to about 3 inches above the skin.  The wires encompassed virtually the entire medial portion of the breast of both the majority of the upper and part of the lower quadrants.  A transverse incision was made to connect the two wires and I dissected down through the subcutaneous tissue. Flaps were created in all directions down to the pectoralis fascia.  A very significant amount of breast tissue needed to be excised.  On palpating this, the majority of the nodules were within the bracketed wires; however, there were a number of both medial and lateral to them.  All abnormal tissue including nodules were excised en bloc with the specimen.  The specimen was sent to pathology to be weighed and then for specimen mammography.  The dissection required going subareolar and extending actually to the lateral portion of the areola down to the pectoralis fascia in all directions. Adequate hemostasis was ensured and the posterior portion of breast tissue was reapproximated using a running 2-0 Vicryl  suture.  The skin tissue was approximated using staples.  Sterile dressing was applied.  The patient tolerated the procedure well and went to post anesthesia care unit in good condition. Dictated by:   Earna Coder, M.D. Attending Physician:  Danna Hefty R. DD:  08/18/01 TD:  08/19/01 Job: [id number]

## 2010-10-31 NOTE — Procedures (Signed)
CLINICAL HISTORY:  The patient is 45 year old woman with possible seizures.  The study is being done to look for the presence of seizure activity.   DESCRIPTION OF PROCEDURE:  The tracing was carried out of 32 channel digital  [place] recorder reformatted into 16 channel  Montages with one devoted to  EKG. The patient was lethargic. She had a seizure-like behavior during the  EEG described as twitching of the face, screaming, followed by hitting  herself with her right hand on her head.   FINDINGS:  Following this behavior, the background settled down and Hz 15  microvolt alpha range activity was seen with superimposed upper theta range  components and frontally predominant under 10 microvolt beta range activity.   During the behaviors, alpha and theta range activity could be seen obscured,  however, by muscle artifact. When the patient was hitting her head, a 5 Hz  artifact was seen.   EKG showed a regular sinus rhythm with ventricular response of 114 beats per  minute.   IMPRESSION:  Normal waking record. The behaviors seen are nonepileptic in  nature.      EAV:WUJW  D:  09/01/2004 21:31:42  T:  09/02/2004 08:13:55  Job #:  [id number]   cc:   Corinna L. Lendell Caprice, MD

## 2010-10-31 NOTE — Op Note (Signed)
NAME:  DOMIQUE, REARDON           ACCOUNT NO.:  [id number]   MEDICAL RECORD NO.:  [id number]          PATIENT TYPE:  EMS   LOCATION:  MAJO                         FACILITY:  [facility]   PHYSICIAN:  Kelle Darting. Rennis Harding, N.P. DATE OF BIRTH:  11-03-1965   DATE OF PROCEDURE:  09/09/2005  DATE OF DISCHARGE:                                 OPERATIVE REPORT   PROCEDURE PERFORMED:  Bedside incision and drainage of left axillary  abscess.   Dr. Lurene Shadow performed this procedure.  The arm was positioned appropriately  to maximize access to the abscess area.  The area was painted with Betadine  and infiltrated with 2% lidocaine with epinephrine.  Using stab wound  technique and excision, the abscess was opened up and cleared of any  purulent drainage.  Additional tissue was excised from the base of the wound  to create a semicircular opening to provide for adequate packing and  drainage.  The wound was packed.  There was minimal bleeding with trace  blood loss.  The patient tolerated the procedure well.      Allison L. Rennis Harding, N.P.     ALE/MEDQ  D:  09/09/2005  T:  09/10/2005  Job:  [id number]   cc:   Alfonse Ras, MD  [address]  [place]  [place] [address]

## 2010-10-31 NOTE — Consult Note (Signed)
[place]. [facility]  Patient:    Mary Wilson, Mary Wilson             MRN: [id number] Proc. Date: 11/07/99 Adm. Date:  [id number] Attending:  Sonda Primes                          Consultation Report  DATE OF BIRTH:  05-15-1966  HISTORY OF PRESENT ILLNESS:  This 45 year old black female had a known prior history of seizures for approximately 20 years thought to be "petit mal seizures" characterized by a weird sensation of taste followed by tremors and shaking of her extremities, very suggestive of secondary generalized major motor seizures.  These would last 3 to 4 minutes, and afterwards she would be confused with headaches and dizziness.  She did not tolerate Dilantin well in the past and had been on Neurontin and Tegretol, but most recently Tegretol alone at 200 mg p.o. t.i.d.  She has a known prior history of Hodgkins disease and is status post enucleation of the right eye.  She has had a right breast biopsy dated Nov 07, 1999, and I am asked to see her because of recurrent seizures.  PAST MEDICAL HISTORY:  Diabetes mellitus, seizure disorder, hypertension, and Lymes disease diagnosed in 1998.  She has had degenerative arthritis and hypothyroidism with history of Hashimoto thyroiditis.  This day she underwent a right biopsy the results of which are unknown.  Following surgery, she had a total of five generalized major motor seizures, each lasting approximately 5 minutes.  ALLERGIES:  PENICILLIN, SULFA, ERYTHROMYCIN.  LABORATORY STUDIES:  Hemoglobin 14.0, white blood cell count 8500, platelets 368,000.  Glucose 83, BUN 9, sodium 138, potassium 4.3, chloride 103, CO2 content 28, creatinine 0.8, BUN 9 and 10, carbamazepine level of 7.4.  PT was 13.4 with an INR of 1.1 and a PTT of 34.  She was loaded with a total of 17 per kg of Dilantin.  She was somewhat lethargic by the time I saw her.  PHYSICAL EXAMINATION:  VITAL SIGNS:  Blood  pressure 120/80, heart rate 100, respiratory rate 14, temperature 97.2.  NEUROLOGIC:  She would open her left eye.  She would look at the examiner. She would follow some commands.  She would give her name.  She was oriented to person.  She would hold up two fingers with each hand when asked and moved all extremities.  Pupil reacted from 3.5 to 3.  The disk was flat.  The extraocular movement in the left eye was full.  She had a corneal.  There was no facial motor asymmetry.  She had an enucleation of her right eye in the orbit.  Her tongue was midline.  Motor examination revealed she moved all extremities.  The reflexes were hyperreactive in the 3+ range.  Plantar responses were downgoing.  She felt pinprick in all extremities.  IMPRESSION: 1. 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 2. History of Hodgkins lymphoma (Diagnosis ICD Code unknown). 3. History of epilepsy (Code 345.10). 4. Diabetes mellitus (Code 250.60). 5. Hypertension (Code 796.2). 6. Hyperlipidemia.  PLAN:  At this time, continue Dilantin.  Obtain a Tegretol and trough carbamazepine level in the morning. DD:  11/07/99 TD:  11/07/99 Job: [id number]

## 2010-10-31 NOTE — Discharge Summary (Signed)
[place]. [facility]  Patient:    Mary Wilson, Mary Wilson             MRN: [id number] Adm. Date:  [id number] Disc. Date: [id number] Attending:  Sonda Primes CC:         Mardene Celeste. Lurene Shadow, M.D. (2 copies)                           Discharge Summary  ADMISSION DIAGNOSIS:  Right breast mass.  DISCHARGE DIAGNOSES: 1. Right breast mass. 2. Seizure disorder (pseudoseizures).  HISTORY OF PRESENT ILLNESS AND HOSPITAL COURSE:  This patient is a 45 year old woman presenting originally with a right-sided breast mass consistent with fibrocystic disease in the past.  She was admitted for excisional biopsy and underwent an uncomplicated excisional biopsy on the date of admission.  In the recovery room she began having what appeared to be seizures, all of these without any postictal syndromes.  The patient was seen in consultation by the medical teaching service, started on Dilantin and Ativan.  At that time, she had a CT scan of the head which was normal.  She was seen in consultation by the neurologist, who suggested that her Tegretol be restarted, and she was also started on some Neurontin.  It was suggested that she was to be continued on IV Dilantin for continued break-through seizures.  She was evaluated, and her prolactin levels were noted to be normal.  A subsequent MRI study of the head was also normal.  At this point her Tegretol levels were at optimum dose. Her seizures subsided subsequently, and on Nov 10, 1999, she was discharged to be followed up in the office by me in a week.  DISCHARGE MEDICATIONS:  Tegretol, Neurontin, Allegra, Nasonex, and Percocet.  ACTIVITY:  As tolerated.  DIET:  Unrestricted. DD:  12/29/99 TD:  12/29/99 Job: [id number]

## 2010-10-31 NOTE — Consult Note (Signed)
NAME:  Mary Wilson, Mary Wilson           ACCOUNT NO.:  [id number]   MEDICAL RECORD NO.:  [id number]          PATIENT TYPE:  EMS   LOCATION:  MAJO                         FACILITY:  [facility]   PHYSICIAN:  Mary Wilson. Rennis Harding, N.P. DATE OF BIRTH:  May 06, 1966   DATE OF CONSULTATION:  09/09/2005  DATE OF DISCHARGE:                                   CONSULTATION   CHIEF COMPLAINT:  Abscess on left axilla.   HISTORY OF PRESENT ILLNESS:  Mary Wilson is a 45 year old female patient  with a significant history of left sided breast cancer status post  mastectomy with recurrence of breast cancer in 2003 status post additional  mastectomy procedure, also Hodgkins lymphoma and seizure disorder.  She had  noticed over the past two months some pressure in her left axilla.  Over the  past four weeks, she has noticed increased pain with fevers and no drainage  from this same area.  She came to the ER for further evaluation.  Her white  count was 13,300.  She was afebrile.  She also notes a tiny lump on the  right anterior chest wall, nontender.  In the past, the patient states when  she has had recurrence of cancerous process, she has developed lumps on the  chest and axillary area.   PAST MEDICAL HISTORY:  1.  Seizure disorder with history of pseudoseizures.  2.  Hodgkin's lymphoma.  3.  Prior bilateral mastectomy, initial carcinoma in the left breast first      and then recurrence in 2003 in the right breast status post mastectomy      procedures per Dr. Jolene Wilson. Mary Wilson.  4.  History of hypertension.  5.  Prior right eye enucleation.   FAMILY HISTORY:  Noncontributory.   SOCIAL HISTORY:  She is married, she does not use tobacco or alcohol.   ALLERGIES:  She is intolerant to Tylenol and NSAIDs.  She is allergic to  ERYTHROMYCIN, PENICILLIN, SULFA, AND TETRACYCLINE.   CURRENT MEDICATIONS:  Lamictal, Lopressor, Nexium, Topamax, Tramadol,  Tylenol, Klonopin, Klor-Con.   REVIEW OF SYMPTOMS:  The  patient has had fever, T-max 102.5 at home, chills.  Otherwise, review of systems is noncontributory.   PHYSICAL EXAMINATION:  VITAL SIGNS:  Temperature 99.5, blood pressure 107/67, pulse 101,  respirations 20.  GENERAL:  Pleasant female currently complaining of left axillary pain.  NECK:  Supple, no adenopathy.  CHEST:  Clear to auscultation, there is a small less than 1 cm mass which is  nontender and nonmobile in the right axillary chest adjacent to the sternum.  BREASTS:  Evidence of prior mastectomy.  Full breast exam not completed.  CARDIAC:  Normal S1 and S2, no murmurs, gallops, and rubs.  ABDOMEN:  Soft, nontender.  EXTREMITIES:  Symmetrical in appearance, no edema or clubbing.  SKIN:  There is a 1.5 cm domed fluid collection in the left axilla with a  small purulent head tender to palpation, peri-dome erythema.   LABORATORY DATA:  White count 13,300, hemoglobin 12.4, platelets 360,000.  Protime, PTT, and INR all within normal limits.  Potassium 3.5, creatinine  0.9.   IMPRESSION:  1.  Folliculitis with focal axillary abscess.  2.  Nodule on the right chest wall in a patient with a history of Hodgkin's      lymphoma and recurrent breast cancer.   PLAN:  Bedside incision and drainage of the abscess was performed per Dr.  Lurene Wilson at the bedside.  The wound was packed.  The patient will be  discharged home from the ER.  She has been given a prescription for Cipro  500 mg p.o. b.i.d. for seven days and Oxycodone for pain.  We have requested  home health to assist with dressing changes after discharge.  She is to  follow up with Dr. Colin Wilson in the office on March 30 at 2:30 p.m. to check the  I&D site as well as to follow up on the questionable lump that possibly  could be a recurrence of carcinoma in the right anterior chest.      Mary Wilson L. Rennis Harding, N.P.     ALE/MEDQ  D:  09/09/2005  T:  09/10/2005  Job:  [id number]

## 2010-10-31 NOTE — Consult Note (Signed)
Mary Wilson, Mary Wilson           ACCOUNT NO.:  [id number]   MEDICAL RECORD NO.:  [id number]          PATIENT TYPE:  INP   LOCATION:  3302                         FACILITY:  [facility]   PHYSICIAN:  Mary Wilson. Mary Wilson, M.D.DATE OF BIRTH:  26-Oct-1965   DATE OF CONSULTATION:  09/02/2004  DATE OF DISCHARGE:                                   CONSULTATION   CHIEF COMPLAINT:  Seizure.   HISTORY OF PRESENT ILLNESS:  Ms. Mary Wilson is a 45 year old African-  American woman with a long and confusing history of seizures.  She reports  starting having seizures at age 21 of unclear etiology.  She apparently was  diagnosed with Hodgkin's lymphoma in childhood, and reportedly had seizures  relating to that, as well.  She eventually required enucleation of her right  eye.  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  She has been seen intermittently by  various neurologists.  She was seen once or twice in [place] by Dr. Sandria Wilson  back in April of 2001, and again in April of 2002.  She has been on multiple  medications, which included Dilantin, Tegretol, Neurontin, Topamax,  Lamictal, and diazepam in various combinations without any good control of  her seizures.  She is currently followed by Dr. Joycie Wilson at [facility], who  is a neurologist.  She had been seen at [facility] by a neurologist  also.  There has been some suspicion along the line of pseudoseizures, and  apparently that was felt to be present in 2001.  She apparently has had an  increase in her frequency of events, and in fact was at [facility] in the  emergency room about a week ago where she was seen by ER personnel, but left  before she would be evaluated by the neurologist, and was supposed to see  him as an outpatient yesterday, but came here instead.  The reasons for that  are not really clear to me.  She  reportedly came in yesterday, on Monday,  with increase in seizure frequency.  It is unclear who her primary physician  is; she does not really have one.  She describes the seizures as being  heralded by a warning sign, and then usually starting in the right upper  extremity and head, followed by generalization.  She does not always have  loss of consciousness, however.  She apparently had four on the day of  admission lasting from 1 minute to 10 minutes.  She has not had any imaging  with this hospitalization, but has had a normal MRI of her brain in the  past, around the time when she reportedly had the intraoperative stroke, and  she had a normal CT by report a week ago.  The patient is somewhat sleepy.  Has been sedated, and gives a somewhat incoherent history.  She is not  really able to tell me very much more about her situation.   REVIEW OF SYSTEMS:  Positive for headaches, reporting a cough,  as well.   PAST MEDICAL HISTORY:  1.  Significant for this seizure versus pseudoseizure with some left-sided      weakness.  Whether that is genuine or not is unclear.  She uses a power      wheelchair at home.  There is a history of Hodgkin's lymphoma with right      eye enucleation as part of that.  In the admission history, there is a      report of a history of cyst on the brain, although MRIs were      reportedly normal in 2001, and a CT was reportedly normal quite      recently.  She had been seen apparently by Dr. Sharene Wilson in 2001 in the      hospital, and it was felt that she had just pseudoseizures.  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  It was also felt it could just be a mucous      retention cyst in the posterior nasopharynx soft tissues, not really in      the brain.  2.  Past medical history otherwise is significant for a history for ductal      carcinoma in situ.  She has had mastectomy; it is unclear which  side      that was on (it looks like it was right, according to the operative      notes from March of 2003).   MEDICATIONS:  The medications listed on this admission include -  1.  Topamax 150 b.i.d.  2.  Lamictal 200 mg twice a day.  3.  Nexium.  4.  Valium.  5.  Potassium supplements.   ALLERGIES:  1.  PENICILLIN.  2.  ERYTHROMYCIN.  3.  SULFA.  4.  TETRACYCLINE.   SOCIAL HISTORY:  She is married.  She does not work.  She uses a power  chair.  No cigarette or alcohol use.  She does use benzodiazepines for  spasm.   FAMILY HISTORY:  Noncontributory.   PHYSICAL EXAMINATION:  VITAL SIGNS:  Currently, temperature is 98.1, pulse  110, respirations 15, blood pressure 132/70.  SKIN:  She has diffuse skin lesions which are weeping in some areas.  I am  not sure what the etiology of this is, all over her face and upper  extremities.  GENERAL:  She is sleepy and has a childlike demeanor.  She has the  enucleation on the right.  MENTAL STATUS:  She drifts off to sleep somewhat during the exam, but on  formal mini mental status exam, her score was somewhat variable, but she  scored as high as 23 on this.  Effort was variable on that.  Her language  seems normal.  CRANIAL NERVES EXAMINATION:  Basically nonfocal.  She does not have a right  eye.  The left pupil is reactive.  She was able to read an eye chart at  20/50, but later told me she could not even see my finger in front of her  face.  She moved her eye normally spontaneously, but when asked to look up,  down, or to the side, she appeared not to be able to do so.  There is no  facial asymmetry noted, other than that due to the enucleation.  Her tongue  is midline, with no evidence of any lacerations.  She claims she had  decreased  hearing on the right to scratching on the pillow.  MOTOR EXAM:  Extremely variable, with drift in all 4 extremities, but poor effort diffusely with a positive Hoover maneuver on the lower  extremities.  Toes were downgoing to plantar stimulation.  Reflexes were diminished.  She  was very slow with finger-to-nose on the left.  Only could partially  complete it.  Was able to do it on the right.  That was also variable and  very infirm to exam.  She was able to do a heel-to-shin on the right.   No imaging has been done at this time.   LABORATORY DATA:  Her labs are largely unremarkable, except she did have a  slightly elevated white blood cell count at 12.8.  Her i-STAT was normal.  The prolactin level was normal.   IMPRESSION:  Seizures plus or minus pseudoseizures.  I agree with Dr.  Pincus Sanes assessment.   RECOMMENDATIONS:  She needs to return to her [facility] neurologist, Dr. Thomasena Edis,  and likely will need epilepsy monitoring long term, perhaps off all of her  medications, to determine the presence and/or frequency of her epileptic  discharges.  If she has true seizures noted, she may need a vagal nerve  stimulator to improve the control.  I would agree with psychiatric  evaluation, but really think that primarily she needs to be evaluated at an  epilepsy center.  I would not make any changes in her regimen at this time.      CAW/MEDQ  D:  09/02/2004  T:  09/02/2004  Job:  [id number]

## 2010-10-31 NOTE — Op Note (Signed)
[place]. [facility]  Patient:    IMMACULATE, CRUTCHER Visit Number: [id number] MRN: [id number]          Service Type: DSU Location: MICU 2109 01 Attending Physician:  Vikki Ports. Dictated by:   Earna Coder, M.D. Proc. 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  OPERATION PERFORMED:  Right total mastectomy with lymphatic mapping, blue dye injection and sentinel lymph node biopsy.  SURGEON:  Earna Coder, M.D.  ASSISTANT:  Sandria Bales. Ezzard Standing, M.D.  ANESTHESIA:  General.  DESCRIPTION OF PROCEDURE:  The patient was taken to the operating room and placed in supine position.  After adequate general anesthesia was induced, the large open defect of the right breast from the previous lumpectomy wound dehiscence was irrigated with Betadine solution.  The entire right breast was then prepped and draped.  5 cc of Lymphazurin blue dye were injected into the retroareolar position.  Using the Neoprobe, an area of high activity was identified in the right axilla and marked with a permanent marker.  An elliptical incision was then made extending out laterally towards the axilla and the anterior axillary line and brought up superior to encompass the entire open cavity and inferiorly below the nipple areolar complex and met medially near the sternum.  Superior flap was fashioned to the clavicle, medially to the sternum, inferiorly to the inframammary fold and laterally to the latissimus dorsi muscle.  After the superior flap had been fashioned laterally using the Neoprobe, a non-hot blue node was identified and sent for evaluation and then a second very hot blue lymph node was also sent for  Touch Prep pathology.  These counts were in the range of 1500.  Background counts were in the range of 10.  After these were excised, the right axilla had no further activity.  All breast tissue was then taken off the pectoralis fascia and sent for pathologic evaluation.  Adequate hemostasis was ensured.  A few perforating muscular arteries were ligating using 2-0 silk ligatures.  The wound was copiously irrigated with 4 to 5 cc of saline solution. Because of the previous wound dehiscence, I opted to close the skin very loosely and pack three to four different areas with Betadine soaked Kerlix.  Sterile dressing was applied.  The patient tolerated the procedure well and went to PACU in good condition. Dictated by:   Earna Coder, M.D. Attending Physician:  Danna Hefty R. DD:  09/08/01 TD:  09/08/01 Job: [id number]

## 2010-10-31 NOTE — H&P (Signed)
NAME:  Mary Wilson, Mary Wilson           ACCOUNT NO.:  [id number]   MEDICAL RECORD NO.:  [id number]          PATIENT TYPE:  EMS   LOCATION:  MAJO                         FACILITY:  [facility]   PHYSICIAN:  Corinna L. Lendell Caprice, MDDATE OF BIRTH:  [birth year]   DATE OF ADMISSION:  08/31/2004  DATE OF DISCHARGE:                                HISTORY & PHYSICAL   CHIEF COMPLAINT:  Multiple seizures.   HISTORY OF PRESENT ILLNESS:  Ms. Mary Wilson is a 45 year old black  female who was brought to the emergency room with increasing frequency of  seizures over the past two weeks.  Her husband initially reports that  Stacie Acres. White, M.D. is her primary care physician and thus I was called.  However, the patient can now report that she is no longer a patient of Dr.  Cliffton Asters, that she has been released from her care.  Her story is very  confusing and database is incomplete.  She reportedly had been seen by Dr.  Graciela Husbands at [facility] who is a neurologist, but apparently has switched to Dr.  Joycie Peek at [facility].  She has only seen him once.  She presented to the  [facility] emergency room last week and was reportedly evaluated for seizures, had  a CAT scan, and was discharged from the emergency room.  She, according to  records from an [place], had some pseudoseizures in 2001.  The seizures today  numbered four and lasted from 1 minute to about 10 minutes, according to her  husband.  They were generalized in nature, not followed by a postictal  period.  According to the ER nurse, the patient had a generalized tonic  clonic seizure which did appear epileptiform.  She was very somnolent and  postictal afterward.  The patient denies any loss of bowel or bladder  function or lip or tongue laceration.  She reports that she had been on  Dilantin, Tegretol, and Neurontin.  However, Dr. Thomasena Edis recently switched  her medications to Topamax and Lamictal.  She also takes Valium for muscle  spasms and has not skipped any  doses.  At this point it is not terribly  clear who had been prescribing her medications previous to Dr. Thomasena Edis.  She  reports that no doctors in [place] will see her, but will not elaborate  as to why.   PAST MEDICAL HISTORY:  Seizure disorder, pseudoseizures according to chart.  Left-sided weakness.  She uses a wheelchair.  History of Hodgkin's lymphoma.  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  History of mastectomy for ductal carcinoma  in situ.  Right eye enucleation.   MEDICATIONS:  1.  Topamax 150 mg p.o. b.i.d.  2.  Kay Ciel 8 mEq p.o. b.i.d.  3.  Lamictal 100 mg p.o. b.i.d.  4.  Nexium 40 mg a day.  5.  Valium 5 mg in the morning and 10 mg in the evening.   SOCIAL HISTORY:  The patient is here with her husband.  She does not work.  She does not smoke, drink, or use drugs.   FAMILY HISTORY:  Noncontributory.   REVIEW OF SYSTEMS:  GENERAL:  No fevers or chills.  HEENT:  She reports that  she gets headaches after having her seizures. No rhinorrhea.  No lacerations  about the mouth.  RESPIRATORY:  She has had a cough and shortness of breath.  CARDIOVASCULAR:  She denies any chest pain.  GASTROINTESTINAL:  She reports  that she has nausea prior to seizures, but has not vomited.  No diarrhea.  SKIN:  She reports that she breaks out from radiation.  NEUROLOGY:  As  above.  PSYCHIATRIC:  She denies any depression.  ENDOCRINE:  According to  chart, she has a history of hypothyroidism and Hashimoto's thyroiditis.  She  has a history of diet-controlled diabetes.  HEMATOLOGIC:  No history of  thromboembolism.  MUSCULOSKELETAL:  She has had knee surgeries.   PHYSICAL EXAMINATION:  VITAL SIGNS:  Temperature 98.2, pulse initially was  136, currently it is 100. Respiratory rate 16, oxygen saturation 100% on  room air.  GENERAL:  The patient is an obese white female who initially was nonverbal,  but is now talking.  HEENT:  Normocephalic and atraumatic.  Her left pupil is reactive, her  right  eye is enucleated.  There are no lip or tongue lacerations.  She has moist  mucous membranes.  Oropharynx is clear.  NECK:  Supple with no lymphadenopathy.  LUNGS:  Clear to auscultation bilaterally without wheezes, rhonchi, or  rales.  HEART:  Regular rate and rhythm without murmurs, rubs, or gallops.  ABDOMEN:  Normal bowel sounds, soft, and nontender.  Nondistended.  It is  obese.  She has lower abdominal scars.  CHEST:  She has evidence of bilateral breast surgery.  EXTREMITIES:  No cyanosis, clubbing, or edema.  NEUROLOGY:  The patient's left eye is open.  She is oriented.  Her speech is  slow, but coherent.  Her upper motor strength is 4 out of 5 on the right,  flaccid on the left.  Deep tendon reflexes are difficult to elicit.  Her  right leg strength is 4 out of 5, 0 out of 5 on the left.  Babinski's  negative.  SKIN:  She has multiple hyperpigmented papules over her trunk and face and  excoriation marks.   LABORATORY DATA:  White blood cell count 12.8, hemoglobin 11.8, hematocrit  35.3, platelet count 348.  PT and PTT are normal.  UA is negative for  nitrites or leukocyte esterase, negative blood, 15 ketones.  ISTAT reveals a  glucose of 89, BUN 10, sodium 140, potassium 3.6, chloride 109, pH 7.337,  pCO2 41.   EKG shows sinus tachycardia, nonspecific changes.   ASSESSMENT:  1.  History of seizure disorder with recurrent seizures today in increasing      frequency.  She reportedly had a CT of the brain at [facility] last week, I      will ask for records.  She is scheduled to have a follow-up appointment      with Dr. Joycie Peek at [facility], her neurologist.  I will admit the      patient to stepdown for observation, continue her outpatient      medications, and we will need to contact Dr. Thomasena Edis in the morning as      database in this case is very incomplete.  2.  History of pseudoseizures per chart. 3.  History of left-sided weakness.  The patient reports this was  after a      massive stroke after surgery but there are no records in the computer  about this.  4.  History of Hodgkin's lymphoma.  5.  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  6.  History of mastectomy for ductal carcinoma in situ.  7.  Right eye enucleation.   In addition to above, I will check a prolactin level to evaluate for seizure  and place the patient on seizure precautions.   She also reports a cough and I will check a chest x-ray to rule out  pneumonia.      CLS/MEDQ  D:  09/01/2004  T:  09/01/2004  Job:  [id number]

## 2010-10-31 NOTE — Consult Note (Signed)
[place]  Patient:    Mary Wilson, Mary Wilson Visit Number: [id number] MRN: [id number]          Service Type: MED Location: [phone] Attending Physician:  Andre Lefort Dictated by:   Teena Irani. Odis Luster, M.D. Proc. Date: 09/22/01 Admit Date:  09/16/2001                            Consultation Report  CHIEF COMPLAINT:  Complicated open wound of the right chest.  HISTORY OF PRESENT ILLNESS:  This is a pleasant 45 year old woman who has had a complicated course status post mastectomy.  Apparently, she had multiple cystic lesions that were identified some weeks ago.  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  She then underwent a completion of mastectomy and has suffered a wound dehiscence.  The mastectomy was about two weeks ago but the dehiscence occurred shortly thereafter.  She has had a wound infection as well.  She seems to have responded to the wound care very nicely and has become afebrile.  Plastic surgery consultation requested for an opinion regarding this wound.  PAST MEDICAL HISTORY:  Positive for hypertension controlled medically, positive for Hodgkins disease.  She had it in 1974 and then had a recurrence in 1990s.  She is now disease free, in remission at the present time.  She is also positive for diabetes which is controlled with diet alone.  Negative for cardiac, renal, GI, or pulmonary disease.  SOCIAL HISTORY:  She is married.  She is unemployed.  She lives locally.  She is a smoker.  Smokes at least five cigarettes a day.  PHYSICAL EXAMINATION  CHEST:  She does have a vac sponge in place in right chest.  The area the the vac sponge occupies is approximately 8 cm x 4 cm.  There is no evidence of any cellulitis or surrounding inflammation.  The drainage from the vac tube is the appropriate serosanguineous  color.  RECOMMENDATIONS:  Today we have had an extensive discussion regarding options. The first thing I recommended that she discontinue smoking all together.  In addition, I agree with the use of the vac and I would continue the vac for at least a two week trial because anything less than that is not a very clear indication for vac potential for this wound.  It is possible that the vac could close this wound and if she has a significant improvement over the next two weeks, I would recommend going a little bit longer with it.  This would certainly potentially either eliminate or certainly limit any type of wound closure procedure that would need to be performed.  Diabetes needs to be controlled as she is doing.  Would consider the intermittent vac setting if she complains of any pain on the continuous vac setting.  I would also recommend going ahead obtaining approval on an outpatient basis for using the vac.  I will try to follow up tomorrow and see this wound at the time of the vac change. Dictated by:   Teena Irani. Odis Luster, M.D. Attending Physician:  Andre Lefort DD:  09/22/01 TD:  09/22/01 Job: [id number]

## 2010-10-31 NOTE — Op Note (Signed)
NAME:  Mary Wilson, Mary Wilson           ACCOUNT NO.:  [id number]   MEDICAL RECORD NO.:  [id number]          PATIENT TYPE:  OIB   LOCATION:  5020                         FACILITY:  [facility]   PHYSICIAN:  Almedia Balls. Ranell Patrick, M.D. DATE OF BIRTH:  03-Jun-1966   DATE OF PROCEDURE:  08/06/2006  DATE OF DISCHARGE:  08/07/2006                               OPERATIVE REPORT   PREOPERATIVE DIAGNOSIS:  Left knee pain secondary to possible medial  meniscus tear with moderate to severe osteoarthritis.   POSTOPERATIVE DIAGNOSIS:  Left knee pain secondary to chondral defects,  medial and patellofemoral compartments, and arthritis throughout the  knee.  No sign of meniscus tear.   PROCEDURE PERFORMED:  Left knee arthroscopy with abrasion chondroplasty  patellofemoral joint and medial femoral condyle, evacuation of multiple  loose bodies, and partial synovectomy.   ATTENDING SURGEON:  Almedia Balls. Ranell Patrick, M.D.   ASSISTANT:  None.   ANESTHESIA:  Local anesthesia plus MAC was used.   ESTIMATED BLOOD LOSS:  Minimal.   FLUIDS REPLACED:  1200 mL of crystalloid.   Instrument count was correct.   COMPLICATIONS:  No complications.   PERIOPERATIVE ANTIBIOTICS:  Given.   INDICATIONS:  The patient is a 45 year old female with a history of  worsening left medial knee pain.  The patient has some history of  paralysis and weakness on the right side and is very dependent on her  left leg.  She has failed conservative management to this point,  consisting of injection therapy anti-inflammatories, presents now for  persistent joint line pain and mechanical symptoms including locking and  giving way in the left knee.  We were concerned about a potential  degenerative meniscus tear and we discussed options with her, including  continuing on her conservative treatment course versus arthroscopy for  debridement and inspection for possible meniscal tear.  She wanted to  proceed with that to relieve pain and improve  function.  Informed  consent was obtained.   DESCRIPTION OF PROCEDURE:  After an adequate level of anesthesia  achieved, the patient was positioned supine on the operating table.  Lateral posterior line, left leg sterilely prepped and draped in its  entirety.  The right leg was secured and padded.  After sterile prep and  draped, we entered the knee arthroscopically using standard arthroscopic  portals, including superolateral outflow, anterolateral scope, and  anteromedial working portals.  The patient had severe chondromalacia  present and the patellofemoral joint was significant full thickness  cartilage wear present midway down the trochlea.  There were loose flaps  and cartilage and multiple loose bodies.  We evacuated those and  smoothed off the loose cartilage using a motorized shaver.  Medial and  lateral gutters were free of loose bodies.  Medial compartment entered.  There was full thickness deep chondral wear and chondral fracture  sections of the medial femoral condyle.  Again, loose flaps of  cartilage.  We smoothed that off using a motorized shaver.  The meniscus  was normal, but was subluxed off the tibial plateau medially.  The tibia  has grade 3 and grade 4 changes as well.  HCL  and PCL intact.  Some  synovium hypertrophic and erythematous synovium removed from the  anterior aspect of the knee.  The lateral compartment entered.  There  was again grade 3 chondromalacia with fissuring and cracking of  cartilage and loose cartilage which we smoothed in a chondroplasty  technique with a motorized shaver.  Femoral cartilage appeared to be  pretty good and the meniscus was normal.  Following complete debridement  of the knee, we went ahead.  There was a small meniscus tear noted on  the lateral side.  The tissue was flopping into the notch.  We removed  that using a motorized shaver.  Following our debridement of the knee  and lavage, we went ahead and concluded surgery with  suturing of the  wounds with 3-0 nylon followed by Steri-Strips and sterile dressing.  The patient had tolerated surgery well.      Almedia Balls. Ranell Patrick, M.D.  Electronically Signed     SRN/MEDQ  D:  08/06/2006  T:  08/07/2006  Job:  [id number]

## 2010-10-31 NOTE — Op Note (Signed)
[place]  Patient:    Mary Wilson, Mary Wilson             MRN: [id number] Proc. Date: 11/07/99 Adm. Date:  [id number] Disc. Date: [id number] Attending:  Sonda Primes CC:         Two copies to Dr. Lurene Shadow                           Operative Report  PREOPERATIVE DIAGNOSIS:  Right breast mass.  POSTOPERATIVE DIAGNOSIS:  Right breast mass probable fibrocystic disease. Pathology pending.  OPERATION:  Excisional biopsy of right breast mass.  SURGEON:  Mardene Celeste. Lurene Shadow, M.D.  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  She is brought to the operating room for excisional biopsy.  DESCRIPTION OF PROCEDURE:  Following the induction of anesthesia, the patient was positioned supinely and the right breast was prepped and draped to be included in a sterile operative field.  A circumareolar incision was carried down on the medial aspect of the right areolar border, deepened through the skin and subcutaneous tissues with dissection carried down through an old scar cicatrix on the inferior border of the nipple. Dissection was carried down around the mass which extended to some extend on to the nipple and extended medially and superiorly within the breast tissue.  This lesion was excised maintaining hemostasis throughout the course of the dissection.  Lesion was then forwarded for pathologic evaluation.  Hemostasis was obtained with electrocautery.  Sponge, instrument, and sharp counts were verified and the wound closed in layers as follows:  The breast tissue was reapproximated with interrupted 3-0 Vicryl sutures.  The subcutaneous tissue was closed with 3-0 Vicryl, 5-0 Monocryl suture placed subcuticularly.  Wounds were  reinforced with Steri-Strips and a sterile dressing applied.  Anesthetic reversed.  The patient removed from the operating room to the recovery room in stable condition, having tolerated the procedure well. DD:  11/07/99 TD:  11/11/99 Job: [id number]

## 2010-10-31 NOTE — Discharge Summary (Signed)
Mary Wilson, Mary Wilson           ACCOUNT NO.:  [id number]   MEDICAL RECORD NO.:  [id number]          PATIENT TYPE:  INP   LOCATION:  5040                         FACILITY:  [facility]   PHYSICIAN:  Almedia Balls. Ranell Patrick, M.D. DATE OF BIRTH:  Aug 26, 1965   DATE OF ADMISSION:  11/04/2007  DATE OF DISCHARGE:  11/07/2007                               DISCHARGE SUMMARY   ADMITTING DIAGNOSIS:  Right shoulder frozen shoulder.   DISCHARGE DIAGNOSIS:  Right shoulder frozen shoulder.   PROCEDURE PERFORMED:  1. Right shoulder arthroscopy.  2. Exam under anesthesia.  3. Manipulation.  4. Arthroscopic capsule release.  5. Release of subdeltoid adhesions performed on Nov 04, 2007.   CONSULTING SERVICES:  Occupational therapy, discharge planning.   HISTORY OF PRESENT ILLNESS:  The patient is a 41-year female status post  shoulder arthroscopy with subsequent post surgical frozen shoulder.  The  patient presents now for arthroscopic capsule release and release of  subdeltoid adhesions.  For further details of the patient's past medical  history and physical examination please see the medical record.   HOSPITAL COURSE:  The patient was admitted on Nov 04, 2007, where she  underwent shoulder arthroscopy, arthroscopic capsule release,  manipulation, and release of subdeltoid adhesions.  The patient did well  postoperatively receiving occupational therapy.  She was managed  initially with IV and oral pain medications, but was discharged on oral  pain medications and muscle relaxants only.  To be followed up by in  home PT and OT with [facility] and she will be following up in  the office for routine postoperative follow-up check.  She is told to ice her shoulder, to move her shoulder at least every  hour during the exercises she has been instructed to do and we will be  seeing her back in 1-2 weeks.      Almedia Balls. Ranell Patrick, M.D.  Electronically Signed     SRN/MEDQ  D:  12/03/2007  T:   12/03/2007  Job:  [id number]

## 2011-03-09 LAB — DIFFERENTIAL
Basophils Absolute: 0.1
Basophils Relative: 1
Eosinophils Absolute: 0.1
Eosinophils Relative: 1
Lymphocytes Relative: 23
Lymphs Abs: 2.7
Monocytes Absolute: 0.6
Monocytes Relative: 5
Neutro Abs: 8.3 — ABNORMAL HIGH
Neutrophils Relative %: 71

## 2011-03-09 LAB — URINALYSIS, ROUTINE W REFLEX MICROSCOPIC
Bilirubin Urine: NEGATIVE
Glucose, UA: NEGATIVE
Hgb urine dipstick: NEGATIVE
Ketones, ur: 15 — AB
Nitrite: NEGATIVE
Protein, ur: NEGATIVE
Specific Gravity, Urine: 1.029
Urobilinogen, UA: 0.2
pH: 5.5

## 2011-03-09 LAB — BASIC METABOLIC PANEL
BUN: 8
CO2: 24
Calcium: 9.6
Chloride: 106
Creatinine, Ser: 0.82
GFR calc Af Amer: >60
GFR calc non Af Amer: >60
Glucose, Bld: 89
Potassium: 4.3
Sodium: 138

## 2011-03-09 LAB — CBC
HCT: 41.3
Hemoglobin: 13.6
MCHC: 32.8
MCV: 81.6
Platelets: 431 — ABNORMAL HIGH
RBC: 5.06
RDW: 16.1 — ABNORMAL HIGH
WBC: 11.7 — ABNORMAL HIGH

## 2011-03-09 LAB — PROTIME-INR
INR: 1
Prothrombin Time: 13.2

## 2011-03-09 LAB — TYPE AND SCREEN
ABO/RH(D): A POS
Antibody Screen: NEGATIVE

## 2011-03-09 LAB — ABO/RH: ABO/RH(D): A POS

## 2011-03-09 LAB — APTT: aPTT: 34

## 2011-03-11 LAB — BASIC METABOLIC PANEL
BUN: 5 — ABNORMAL LOW
BUN: 7
CO2: 24
CO2: 24
Calcium: 8.5
Calcium: 9.6
Chloride: 106
Chloride: 107
Creatinine, Ser: 0.81
Creatinine, Ser: 0.82
GFR calc Af Amer: >60
GFR calc Af Amer: >60
GFR calc non Af Amer: >60
GFR calc non Af Amer: >60
Glucose, Bld: 101 — ABNORMAL HIGH
Glucose, Bld: 93
Potassium: 3.4 — ABNORMAL LOW
Potassium: 4
Sodium: 139
Sodium: 140

## 2011-03-11 LAB — URINALYSIS, ROUTINE W REFLEX MICROSCOPIC
Glucose, UA: NEGATIVE
Ketones, ur: 15 — AB
Nitrite: NEGATIVE
Protein, ur: NEGATIVE
Specific Gravity, Urine: 1.024
Urobilinogen, UA: 1
pH: 5.5

## 2011-03-11 LAB — DIFFERENTIAL
Basophils Absolute: 0
Basophils Relative: 0
Eosinophils Absolute: 0.1
Eosinophils Relative: 1
Lymphocytes Relative: 17
Lymphs Abs: 2.2
Monocytes Absolute: 0.7
Monocytes Relative: 6
Neutro Abs: 9.7 — ABNORMAL HIGH
Neutrophils Relative %: 76

## 2011-03-11 LAB — CBC
HCT: 41.8
Hemoglobin: 13.6
MCHC: 32.5
MCV: 81.9
Platelets: 346
RBC: 5.11
RDW: 17.1 — ABNORMAL HIGH
WBC: 12.7 — ABNORMAL HIGH

## 2011-03-11 LAB — URINE MICROSCOPIC-ADD ON

## 2011-03-11 LAB — PROTIME-INR
INR: 0.9
Prothrombin Time: 12

## 2011-03-11 LAB — APTT: aPTT: 33

## 2016-04-04 DIAGNOSIS — I639 Cerebral infarction, unspecified: Secondary | ICD-10-CM | POA: Insufficient documentation

## 2016-04-04 DIAGNOSIS — M62838 Other muscle spasm: Secondary | ICD-10-CM | POA: Insufficient documentation

## 2016-04-04 DIAGNOSIS — I10 Essential (primary) hypertension: Secondary | ICD-10-CM | POA: Insufficient documentation

## 2016-04-04 DIAGNOSIS — G8194 Hemiplegia, unspecified affecting left nondominant side: Secondary | ICD-10-CM | POA: Insufficient documentation

## 2016-04-26 ENCOUNTER — Ambulatory Visit (HOSPITAL_COMMUNITY)
Admission: EM | Admit: 2016-04-26 | Discharge: 2016-04-26 | Disposition: A | Discharge status: Home / Self Care | Payer: PRIVATE HEALTH INSURANCE | Attending: Family Medicine | Admitting: Family Medicine

## 2016-04-26 ENCOUNTER — Encounter (HOSPITAL_COMMUNITY): Payer: Self-pay | Admitting: *Deleted

## 2016-04-26 DIAGNOSIS — J40 Bronchitis, not specified as acute or chronic: Secondary | ICD-10-CM | POA: Diagnosis not present

## 2016-04-26 DIAGNOSIS — J0100 Acute maxillary sinusitis, unspecified: Secondary | ICD-10-CM | POA: Diagnosis not present

## 2016-04-26 HISTORY — DX: Type 2 diabetes mellitus without complications: E11.9

## 2016-04-26 HISTORY — DX: Unspecified asthma, uncomplicated: J45.909

## 2016-04-26 MED ORDER — HYDROCODONE-HOMATROPINE 5-1.5 MG/5ML PO SYRP: 5.0000 mL | ORAL_SOLUTION | Freq: Four times a day (QID) | ORAL | 0 refills | Status: DC | PRN

## 2016-04-26 MED ORDER — LEVOFLOXACIN 500 MG PO TABS: 500.0000 mg | ORAL_TABLET | Freq: Every day | ORAL | 0 refills | Status: DC

## 2016-04-26 MED ORDER — ALBUTEROL SULFATE HFA 108 (90 BASE) MCG/ACT IN AERS: 2.0000 | INHALATION_SPRAY | Freq: Four times a day (QID) | RESPIRATORY_TRACT | 5 refills | Status: DC | PRN

## 2016-04-26 NOTE — ED Provider Notes (Signed)
[place]    CSN: [id number] Arrival date & time: 04/26/16  1222     History   Chief Complaint Chief Complaint  Patient presents with  . Facial Pain  . Wheezing    HPI Mary Wilson is a 50 y.o. female.   This a 50 year old woman who presents to the urgent care center giving a history of cough for 2 days. She is a nonsmoker but does have a history of asthma. 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  Patient has not been able to cough up any of the phlegm. She has a history of bronchitis turning into pneumonia and she has been running a low-grade fever. She complains of facial pressure and headache and the cough is painful. She's had no nausea or vomiting. She has no shortness of breath.      Past Medical History:  Diagnosis Date  . Asthma   . CVA (cerebral infarction)   . Diabetes mellitus without complication ([place])    now under control; no meds as of 04/26/16  . Hodgkin's disease ([place])   . HTN (hypertension)   . Hyperlipidemia     There are no active problems to display for this patient.   Past Surgical History:  Procedure Laterality Date  . ABDOMINAL HYSTERECTOMY    . MASTECTOMY     bilat    OB History    No data available       Home Medications    Prior to Admission medications   Medication Sig Start Date End Date Taking? Authorizing Provider  amLODipine (NORVASC) 5 MG tablet Take 5 mg by mouth daily.   Yes Historical Provider, MD  atorvastatin (LIPITOR) 40 MG tablet Take 40 mg by mouth daily.   Yes Historical Provider, MD  baclofen (LIORESAL) 20 MG tablet Take 20 mg by mouth 3 (three) times daily.   Yes Historical Provider, MD  budesonide-formoterol (SYMBICORT) 80-4.5 MCG/ACT inhaler Inhale 2 puffs into the lungs 2 (two) times daily.   Yes Historical Provider, MD  esomeprazole (NEXIUM) 40 MG capsule Take 40 mg by mouth daily before breakfast.   Yes Historical Provider, MD  estradiol  (CLIMARA - DOSED IN MG/24 HR) 0.1 mg/24hr patch Place 0.1 mg onto the skin once a week.   Yes Historical Provider, MD  ibuprofen (ADVIL,MOTRIN) 800 MG tablet Take 800 mg by mouth every 8 (eight) hours as needed.   Yes Historical Provider, MD  loratadine (CLARITIN) 10 MG tablet Take 10 mg by mouth daily.   Yes Historical Provider, MD  losartan (COZAAR) 25 MG tablet Take 25 mg by mouth 2 (two) times daily.   Yes Historical Provider, MD  metoprolol succinate (TOPROL-XL) 100 MG 24 hr tablet Take 100 mg by mouth daily. Take with or immediately following a meal.   Yes Historical Provider, MD  omeprazole (PRILOSEC) 20 MG capsule Take 20 mg by mouth daily.   Yes Historical Provider, MD  tiZANidine (ZANAFLEX) 2 MG tablet Take by mouth at bedtime.   Yes Historical Provider, MD  traZODone (DESYREL) 150 MG tablet Take by mouth at bedtime.   Yes Historical Provider, MD  umeclidinium-vilanterol (ANORO ELLIPTA) 62.5-25 MCG/INH AEPB Inhale 1 puff into the lungs daily.   Yes Historical Provider, MD  albuterol (PROVENTIL HFA;VENTOLIN HFA) 108 (90 Base) MCG/ACT inhaler Inhale 2 puffs into the lungs every 6 (six) hours as needed. 04/26/16   Robyn Haber, MD  HYDROcodone-homatropine [facility]) 5-1.5 MG/5ML syrup Take 5  mLs by mouth every 6 (six) hours as needed for cough. 04/26/16   Robyn Haber, MD  levofloxacin (LEVAQUIN) 500 MG tablet Take 1 tablet (500 mg total) by mouth daily. 04/26/16   Robyn Haber, MD    Family History No family history on file.  Social History Social History  Substance Use Topics  . Smoking status: Current Some Day Smoker  . Smokeless tobacco: Never Used  . Alcohol use No     Allergies   Penicillins; Sulfa antibiotics; Erythromycin; Morphine and related; and Tetracyclines & related   Review of Systems Review of Systems  Constitutional: Positive for fatigue and fever.  HENT: Positive for congestion, facial swelling, sinus pain and sinus pressure. Negative for ear  discharge and ear pain.   Eyes: Negative.   Respiratory: Positive for cough and chest tightness. Negative for shortness of breath.   Cardiovascular: Negative.   Gastrointestinal: Negative.   Neurological: Positive for headaches.     Physical Exam Triage Vital Signs ED Triage Vitals  Enc Vitals Group     BP 04/26/16 1345 156/93     Pulse Rate 04/26/16 1345 66     Resp 04/26/16 1345 18     Temp 04/26/16 1345 98.8 F (37.1 C)     Temp Source 04/26/16 1345 Oral     SpO2 04/26/16 1345 99 %     Weight --      Height --      Head Circumference --      Peak Flow --      Pain Score 04/26/16 1354 10     Pain Loc --      Pain Edu? --      Excl. in [place]? --    No data found.   Updated Vital Signs BP 156/93 (BP Location: Right Arm)   Pulse 66   Temp 98.8 F (37.1 C) (Oral)   Resp 18   SpO2 99%    Physical Exam  Constitutional: She is oriented to person, place, and time. She appears well-developed and well-nourished.  HENT:  Head: Normocephalic and atraumatic.  Right Ear: External ear normal.  Left Ear: External ear normal.  Mouth/Throat: Oropharynx is clear and moist.  Mark swelling in both nasal passages with increased erythema posteriorly  Eyes:  Prosthetic right eye, left eye appears normal  Neck: Normal range of motion. Neck supple.  Cardiovascular: Normal rate and regular rhythm.   Pulmonary/Chest: Effort normal. She has wheezes.  Bibasilar rhonchi and wheezes  Musculoskeletal: Normal range of motion.  Neurological: She is alert and oriented to person, place, and time. She exhibits normal muscle tone.  Skin: Skin is warm and dry.  Psychiatric: She has a normal mood and affect.  Nursing note and vitals reviewed.    UC Treatments / Results  Labs (all labs ordered are listed, but only abnormal results are displayed) Labs Reviewed - No data to display  EKG  EKG Interpretation None       Radiology No results found.  Procedures Procedures (including  critical care time)  Medications Ordered in UC Medications - No data to display   Initial Impression / Assessment and Plan / UC Course  I have reviewed the triage vital signs and the nursing notes.  Pertinent labs & imaging results that were available during my care of the patient were reviewed by me and considered in my medical decision making (see chart for details).  Clinical Course     Final Clinical Impressions(s) / UC Diagnoses  Final diagnoses:  Bronchitis  Acute maxillary sinusitis, recurrence not specified    New Prescriptions New Prescriptions   HYDROCODONE-HOMATROPINE (HYCODAN) 5-1.5 MG/5ML SYRUP    Take 5 mLs by mouth every 6 (six) hours as needed for cough.   LEVOFLOXACIN (LEVAQUIN) 500 MG TABLET    Take 1 tablet (500 mg total) by mouth daily.     Robyn Haber, MD 04/26/16 [phone]

## 2016-04-26 NOTE — ED Triage Notes (Signed)
C/O facial/head pressure, non-productive wheezy cough x 2 days.  Has been using albuterol inhaler.  No fevers.

## 2016-07-25 DIAGNOSIS — K581 Irritable bowel syndrome with constipation: Secondary | ICD-10-CM | POA: Insufficient documentation

## 2016-07-25 DIAGNOSIS — Z8673 Personal history of transient ischemic attack (TIA), and cerebral infarction without residual deficits: Secondary | ICD-10-CM | POA: Insufficient documentation

## 2016-07-25 DIAGNOSIS — Z8571 Personal history of Hodgkin lymphoma: Secondary | ICD-10-CM | POA: Insufficient documentation

## 2016-08-11 ENCOUNTER — Ambulatory Visit: Payer: Medicare HMO | Attending: Nurse Practitioner | Admitting: Physical Therapy

## 2016-08-11 DIAGNOSIS — R262 Difficulty in walking, not elsewhere classified: Secondary | ICD-10-CM | POA: Insufficient documentation

## 2016-08-11 DIAGNOSIS — R296 Repeated falls: Secondary | ICD-10-CM | POA: Diagnosis present

## 2016-08-11 DIAGNOSIS — M6281 Muscle weakness (generalized): Secondary | ICD-10-CM | POA: Insufficient documentation

## 2016-08-12 ENCOUNTER — Encounter: Payer: Self-pay | Admitting: Physical Therapy

## 2016-08-12 NOTE — Therapy (Addendum)
[place], [place], [address] Phone: [phone]   Fax:  [phone]  Physical Therapy Evaluation/ Discharge Patient Details  Name: Mary Wilson MRN: [id number] Date of Birth: [birth year] Referring Provider: Vicenta Aly PA  Encounter Date: 08/11/2016      PT End of Session - 08/11/16 1652    Visit Number 1   Number of Visits 16   Authorization Type medicare/ humana    PT Start Time [phone]   PT Stop Time 0520   PT Time Calculation (min) 48 min   Activity Tolerance Patient tolerated treatment well   Behavior During Therapy [facility] for tasks assessed/performed      Past Medical History:  Diagnosis Date  . Asthma   . CVA (cerebral infarction)   . Diabetes mellitus without complication ([place])    now under control; no meds as of 04/26/16  . Hodgkin's disease ([place])   . HTN (hypertension)   . Hyperlipidemia     Past Surgical History:  Procedure Laterality Date  . ABDOMINAL HYSTERECTOMY    . MASTECTOMY     bilat    There were no vitals filed for this visit.       Subjective Assessment - 08/12/16 0938    Subjective Patient reports her main concern today is getting a new lofstrand curutch. She lost hers when she moved up from [place]. She feels like her balce is poor without it. She feels like she falls over the top of a single point cane. She feels like her balance and strength have been decreasing over the past year. She feels like she has been bumping and falling into walls. She has not had any falls though. She was going to the gym but at this time she feels like she is having issues with strength and balance with old activity.    Pertinent History Stroke 2006; Bilateral knee OA    Limitations Standing;Walking   How long can you sit comfortably? No limit   How long can you stand comfortably? < 20 min    How long can you walk comfortably? Limited community distances    Diagnostic tests Nothing  recently    Patient Stated Goals To get a lofstrand crutch and to improve pain    Currently in Pain? Yes   Pain Score 4    Pain Location Knee   Pain Orientation Left   Pain Descriptors / Indicators Aching   Pain Type Chronic pain   Pain Onset More than a month ago   Pain Frequency Constant   Aggravating Factors  standing/ walking    Pain Relieving Factors rest   Effect of Pain on Daily Activities difficulty perfroming ADL;s             [facility] PT Assessment - 08/12/16 0001      Assessment   Medical Diagnosis Left sided weakness and muscle tightness    Referring Provider Vicenta Aly PA   Onset Date/Surgical Date --  2006   Hand Dominance Right   Next MD Visit nothing scheduled    Prior Therapy In 2006      Precautions   Precautions Fall   Precaution Comments Patient reports she frequently bumped into walls     Restrictions   Weight Bearing Restrictions No   Other Position/Activity Restrictions n     Balance Screen   Has the patient fallen in the past 6 months No  Has frequnt loss of balane and bumps into walls at  times    Has the patient had a decrease in activity level because of a fear of falling?  Yes   Is the patient reluctant to leave their home because of a fear of falling?  Yes     [place] residence   Home Access Stairs to enter   Entrance Stairs-Number of Steps [place] One level     Prior Function   Level of Independence Independent   Leisure walks and goes to the gym      Cognition   Overall Cognitive Status Within Functional Limits for tasks assessed   Attention Focused   Focused Attention Appears intact   Memory Appears intact   Awareness Appears intact   Problem Solving Appears intact     Observation/Other Assessments   Focus on Therapeutic Outcomes (FOTO)  66 % limitation      Sensation   Additional Comments left foot is numb     Coordination   Gross Motor Movements are Fluid and  Coordinated No   Fine Motor Movements are Fluid and Coordinated No   Coordination and Movement Description decreased coordination of the left leg      Posture/Postural Control   Posture/Postural Control Postural limitations   Postural Limitations Rounded Shoulders;Forward head     ROM / Strength   AROM / PROM / Strength AROM;PROM;Strength     AROM   Overall AROM Comments hip flexion to 80 degrees in supine; Shoulder flexion left 120      PROM   Overall PROM Comments PROM of left hip to 90 degrees; HIP ER/ IR limited compared to the right;    PROM Assessment Site Hip     Strength   Strength Assessment Site Hip;Knee;Ankle   Right/Left Hip Left;Right   Right Hip Flexion 4/5   Right Hip ABduction 4+/5   Left Hip Flexion 3/5   Left Hip ABduction 3/5   Left Hip ADduction 3/5   Right/Left Knee Left   Left Knee Flexion 4+/5   Left Knee Extension 4+/5   Right/Left Ankle Left   Left Ankle Dorsiflexion 3+/5     Palpation   Palpation comment No tenderness to plapation      Transfers   Comments Sit to Stand SBA for initial standing balance      Ambulation/Gait   Gait Comments Decreased hip flexion on the left. Significant lateral movement with gait, decreased single leg stance on the right.      High Level Balance   High Level Balance Comments Narrow base of support Mod a Narrow base of support eyes closed max a; tandem stance bilateral unable to maintain balance                    [facility] Adult PT Treatment/Exercise - 08/12/16 0001      Lumbar Exercises: Stretches   Single Knee to Chest Stretch Limitations 2x20sec left   Piriformis Stretch Limitations 2x20 sec left      Knee/Hip Exercises: Supine   Quad Sets Limitations 2x10 miniaml contraction noted                 PT Education - 08/12/16 1227    Education provided Yes   Education Details Updated HEP, reviewed symptom mangement. Improtance of activiating the quad.    Person(s) Educated Patient    Methods Explanation;Demonstration   Comprehension Verbalized understanding;Returned demonstration          PT Short Term Goals -  08/12/16 1002      PT SHORT TERM GOAL #1   Title Patient will increase passive left hip flexion to 95 degrees    Time 4   Period Weeks   Status New     PT SHORT TERM GOAL #2   Title Patient will perfrom standing balance with a narrow base of support without assist for 20 seconds    Time 4   Period Weeks   Status New     PT SHORT TERM GOAL #3   Title POatient will increase gross left LE strangth to 4+/5    Time 4   Period Weeks   Status New     PT SHORT TERM GOAL #4   Title Patient wil be independent with initial HEP    Time 4   Period Weeks   Status New           PT Long Term Goals - 08/12/16 1004      PT LONG TERM GOAL #1   Title Patient will stand for 30 minutes without loss of balance or increase in knee pain on order to perfrom ADL's    Time 8   Period Weeks   Status New     PT LONG TERM GOAL #2   Title Patient will ambualte 3000' with least restrictive assistive device without loss of balance    Time 8   Period Weeks   Status New     PT LONG TERM GOAL #3   Title Patient will increase BERG score to > 40 to show a reduced fall risk    Time 8   Period Weeks   Status New               Plan - 08/12/16 8891    Clinical Impression Statement Patient is a 52 year old female S/P stroke in 2006 that effected her left side. She has left sided UE/LE weakness and stiffness in the left hip. She was using a lofstrand crutch but she does not have it any longer. She felt much more steady with her lofstrand crutch. Patient has poor dynamic balance. She would benefit from skilled therapy to adress the above defciits. She was sene for a moderate complexity evaluation.    Rehab Potential Good   PT Frequency 2x / week   PT Duration 8 weeks   PT Treatment/Interventions ADLs/Self Care Home Management;Cryotherapy;Electrical  Stimulation;Moist Heat;Ultrasound;Gait training;Stair training;Functional mobility training;Therapeutic activities;Therapeutic exercise;Patient/family education;Balance training;Manual techniques;Passive range of motion   PT Next Visit Plan add in heel slides, SAQ, ball squeeze, hip abdcution, work on balance activity standing, review stretching, continue to follow up on Lofstrand crutch; Perfrom BERG balance test; Quad set    PT Home Exercise Plan piriformis stretch with towel, single knee to chest stretch with towel   Consulted and Agree with Plan of Care Patient      Patient will benefit from skilled therapeutic intervention in order to improve the following deficits and impairments:  Abnormal gait, Decreased activity tolerance, Decreased mobility, Decreased strength, Impaired sensation, Decreased balance, Pain, Decreased safety awareness, Increased muscle spasms, Impaired UE functional use, Difficulty walking, Decreased range of motion  Visit Diagnosis: Difficulty in walking, not elsewhere classified - Plan: PT plan of care cert/re-cert  Repeated falls - Plan: PT plan of care cert/re-cert  Muscle weakness (generalized) - Plan: PT plan of care cert/re-cert      G-Codes - 69/45/03 1009    Functional Assessment Tool Used (Outpatient Only) FOTO, balance testing, clinical  decision making    Functional Limitation Mobility: Walking and moving around   Mobility: Walking and Moving Around Current Status [phone]) At least 60 percent but less than 80 percent impaired, limited or restricted   Mobility: Walking and Moving Around Goal Status [phone]) At least 40 percent but less than 60 percent impaired, limited or restricted     PHYSICAL THERAPY DISCHARGE SUMMARY  Visits from Start of Care: 1  Current functional level related to goals / functional outcomes: Patient never returned for therapy and did not call in reference to her crutch.   Remaining deficits: Unknown   Education /  Equipment: Unknown  Plan: Patient agrees to discharge.  Patient goals were not met. Patient is being discharged due to not returning since the last visit.  ?????     Problem List There are no active problems to display for this patient.   Carney Living PT DPT  08/12/2016, 12:56 PM  [facility] [address] [place], [place], [address] Phone: [phone]   Fax:  [phone]  Name: Mary Wilson MRN: [id number] Date of Birth: 1966-05-07

## 2016-08-19 ENCOUNTER — Ambulatory Visit: Payer: Medicare HMO | Admitting: Physical Therapy

## 2016-08-20 ENCOUNTER — Ambulatory Visit: Payer: Medicare HMO | Admitting: Physical Therapy

## 2016-08-24 ENCOUNTER — Ambulatory Visit: Payer: Medicare HMO | Attending: Nurse Practitioner | Admitting: Physical Therapy

## 2016-08-26 ENCOUNTER — Ambulatory Visit: Payer: Medicare HMO | Admitting: Physical Therapy

## 2016-08-27 ENCOUNTER — Telehealth: Payer: Self-pay | Admitting: Physical Therapy

## 2016-08-27 NOTE — Telephone Encounter (Signed)
Called patient in reference to 2 no-show appointments on 08/26/2016. Also asked her to call in reference to her Lofstrand crutch order and who to send it too. Patient asked to call back in reference to the crutch and her next appointment.

## 2016-08-31 ENCOUNTER — Ambulatory Visit: Payer: Medicare HMO | Admitting: Physical Therapy

## 2016-09-02 ENCOUNTER — Ambulatory Visit: Payer: Medicare HMO | Admitting: Physical Therapy

## 2016-09-07 ENCOUNTER — Ambulatory Visit: Payer: Medicare HMO | Admitting: Physical Therapy

## 2016-09-09 ENCOUNTER — Ambulatory Visit: Payer: Medicare HMO | Admitting: Physical Therapy

## 2016-09-14 ENCOUNTER — Encounter: Payer: Medicare HMO | Admitting: Physical Therapy

## 2016-09-16 ENCOUNTER — Encounter: Payer: Medicare HMO | Admitting: Physical Therapy

## 2017-10-07 DIAGNOSIS — Z79899 Other long term (current) drug therapy: Secondary | ICD-10-CM | POA: Insufficient documentation

## 2017-10-26 ENCOUNTER — Ambulatory Visit (INDEPENDENT_AMBULATORY_CARE_PROVIDER_SITE_OTHER): Payer: Medicare Other | Admitting: Neurology

## 2017-10-26 ENCOUNTER — Encounter: Payer: Self-pay | Admitting: Neurology

## 2017-10-26 VITALS — BP 101/70 | HR 66 | Ht 67.0 in | Wt 247.2 lb

## 2017-10-26 DIAGNOSIS — E785 Hyperlipidemia, unspecified: Secondary | ICD-10-CM | POA: Diagnosis not present

## 2017-10-26 DIAGNOSIS — H5440 Blindness, one eye, unspecified eye: Secondary | ICD-10-CM | POA: Insufficient documentation

## 2017-10-26 DIAGNOSIS — Z853 Personal history of malignant neoplasm of breast: Secondary | ICD-10-CM | POA: Diagnosis not present

## 2017-10-26 DIAGNOSIS — H548 Legal blindness, as defined in USA: Secondary | ICD-10-CM | POA: Diagnosis not present

## 2017-10-26 DIAGNOSIS — E1159 Type 2 diabetes mellitus with other circulatory complications: Secondary | ICD-10-CM | POA: Diagnosis not present

## 2017-10-26 DIAGNOSIS — R269 Unspecified abnormalities of gait and mobility: Secondary | ICD-10-CM | POA: Diagnosis not present

## 2017-10-26 DIAGNOSIS — M329 Systemic lupus erythematosus, unspecified: Secondary | ICD-10-CM

## 2017-10-26 DIAGNOSIS — R200 Anesthesia of skin: Secondary | ICD-10-CM | POA: Insufficient documentation

## 2017-10-26 DIAGNOSIS — E119 Type 2 diabetes mellitus without complications: Secondary | ICD-10-CM | POA: Insufficient documentation

## 2017-10-26 DIAGNOSIS — R202 Paresthesia of skin: Secondary | ICD-10-CM

## 2017-10-26 NOTE — Patient Instructions (Addendum)
-   continue current medications - check BP at home and record. Call your PCP for medicaiton changes if BP too high or too low - follow up with eye doctor for vision difficulty, rheumatologist for lupus - will do nerve conduction study for left arm and b/l leg numbness and pain  - will refer to PT for balance training and also for device helping for walking - Follow up with your primary care physician for risk factor modification. Recommend maintain blood pressure goal <130/80, diabetes with hemoglobin A1c goal below 7.0% and lipids with LDL cholesterol goal below 70 mg/dL.  - healthy diet and self exercise - avoid fall - follow up in 2 months with Janett Billow

## 2017-10-26 NOTE — Progress Notes (Signed)
NEUROLOGY CLINIC NEW PATIENT NOTE  NAME: Mary Wilson [place] DOB: [birth year] REFERRING PHYSICIAN: Vicenta Aly, FNP  I saw [facility] as a new consult in the neurovascular clinic today regarding  Chief Complaint  Patient presents with  . Referral    Referral from Vicenta Aly Np for increase left side weakness over last several months, and RUE  weakness, an balance PT was  stroke in 2006, room 2 pt  is with  .  HPI: Mary Wilson is a 52 y.o. female with PMH of hypertension, hyperlipidemia, diabetes, stroke in 2004, lupus, bilateral breast cancer s/p double mastectomy+chemo+Rx, right eye totally blind with prosthetics, and left eye legally blind who presents as a new patient for gait imbalance and left arm and b/l leg numbness tingling.   Patient stated that in 2004, she had bilateral meniscectomy with b/l breast reconstitution in [place], [place].  After surgery, she was discharged home. At home, she was standing in the bathroom, she passed out and fell, hitting her head with concussion, sent to hospital and was told to have a "slight stroke" on the right side head without bleeding. Since then, she had left sided weakness, initially need wheelchair, and then gradually improved with PT/OT. However, on MRI in 2006, there was no any evidence of previous stroke or TBI. Last year, pt moved from [place] to [place] due to [place] damage to her home in [place]. Since then, she felt physically decline with dizziness on walking, falling forward, leaning to right on walking, neck and back pain, left arm and BLE numbness and tingling, left side weakness getting worse. PCP referred her here for further evaluation.   Pt has hx of lupus with joint pain ans swelling all over the body. Currently taking plaquenil and MTX. Hx of HTN and HLD, on meds. Her right eye removed in 1993 due to cataract and lens protrusion, and now replaced with prosthetics. Left eye significant tunnel vision  and vision decrease, legally blind. Has seizure since young, last one 7-8 years ago. Also has neck and back pain, with shooting pain from b/l hips to legs, making her waking difficulties. Also has neck pain, sharp, radiating to b/l shoulders.   Quit smoking in 2016, denies alcohol or illicit drugs.  Past Medical History:  Diagnosis Date  . Asthma   . CVA (cerebral infarction)   . Diabetes mellitus without complication ([place])    now under control; no meds as of 04/26/16  . Esophageal reflux   . 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 ([place])   . Hodgkin disease ([place])   . Hodgkin's disease ([place])   . HTN (hypertension)   . Hyperlipidemia   . Intractable migraine   . Legally blind   . Lupus erythematosus   . Muscle spasticity   . Stroke ([place])   . Vision abnormalities   . Vitamin D deficiency    Past Surgical History:  Procedure Laterality Date  . ABDOMINAL HYSTERECTOMY    . MASTECTOMY     bilat   History reviewed. No pertinent family history. Current Outpatient Medications  Medication Sig Dispense Refill  . Acetaminophen (MAPAP) 500 MG coapsule Take by mouth.    Marland Kitchen albuterol (PROVENTIL HFA) 108 (90 Base) MCG/ACT inhaler Inhale into the lungs.    Marland Kitchen atorvastatin (LIPITOR) 40 MG tablet Take 40 mg by mouth daily.    . baclofen (LIORESAL) 20 MG tablet Take 20 mg by mouth 3 (three) times daily.    Marland Kitchen  cholecalciferol (VITAMIN D) 1000 units tablet Take by mouth.    . esomeprazole (NEXIUM) 40 MG capsule Take 40 mg by mouth daily before breakfast.    . folic acid (FOLVITE) 132 MCG tablet Take by mouth.    . hydroxychloroquine (PLAQUENIL) 200 MG tablet Take by mouth.    Marland Kitchen ibuprofen (ADVIL,MOTRIN) 800 MG tablet Take 800 mg by mouth every 8 (eight) hours as needed.    . loratadine (CLARITIN) 10 MG tablet Take 10 mg by mouth daily.    Marland Kitchen losartan (COZAAR) 25 MG tablet Take 25 mg by mouth 2 (two) times daily.    . methotrexate 50 MG/2ML injection Inject into the  vein.    . metoprolol succinate (TOPROL-XL) 100 MG 24 hr tablet Take 100 mg by mouth daily. Take with or immediately following a meal.    . Multiple Vitamin (MULTIVITAMIN) capsule Take by mouth.    Marland Kitchen omeprazole (PRILOSEC) 20 MG capsule Take 20 mg by mouth daily.    . polyethylene glycol powder (GLYCOLAX/MIRALAX) powder MIX 17 GRAMS IN LIQUID AND DRINK DAILY AS NEEDED    . tiZANidine (ZANAFLEX) 2 MG tablet Take by mouth at bedtime.    . traZODone (DESYREL) 150 MG tablet Take by mouth at bedtime.    Marland Kitchen umeclidinium-vilanterol (ANORO ELLIPTA) 62.5-25 MCG/INH AEPB Inhale 1 puff into the lungs daily.    . valsartan-hydrochlorothiazide (DIOVAN-HCT) 320-12.5 MG tablet TAKE 1 TABLET EVERY DAY    . vitamin B-12 (CYANOCOBALAMIN) 1000 MCG tablet Take by mouth.    . Vitamin E 400 units TABS Take by mouth.     No current facility-administered medications for this visit.    Allergies  Allergen Reactions  . Erythromycin Nausea And Vomiting and Shortness Of Breath  . Penicillins Anaphylaxis  . Sulfa Antibiotics Anaphylaxis and Nausea And Vomiting  . Morphine Other (See Comments)    insomnia  . Tetracyclines & Related Nausea And Vomiting  . Morphine And Related Other (See Comments)    Stop breathing   Social History   Socioeconomic History  . Marital status: Married    Spouse name: Not on file  . Number of children: Not on file  . Years of education: Not on file  . Highest education level: Not on file  Occupational History  . Not on file  Social Needs  . Financial resource strain: Not on file  . Food insecurity:    Worry: Not on file    Inability: Not on file  . Transportation needs:    Medical: Not on file    Non-medical: Not on file  Tobacco Use  . Smoking status: Former Smoker    Last attempt to quit: 10/27/2014    Years since quitting: 3.0  . Smokeless tobacco: Never Used  Substance and Sexual Activity  . Alcohol use: No  . Drug use: No  . Sexual activity: Not on file  Lifestyle    . Physical activity:    Days per week: Not on file    Minutes per session: Not on file  . Stress: Not on file  Relationships  . Social connections:    Talks on phone: Not on file    Gets together: Not on file    Attends religious service: Not on file    Active member of club or organization: Not on file    Attends meetings of clubs or organizations: Not on file    Relationship status: Not on file  . Intimate partner violence:    Fear  of current or ex partner: Not on file    Emotionally abused: Not on file    Physically abused: Not on file    Forced sexual activity: Not on file  Other Topics Concern  . Not on file  Social History Narrative  . Not on file    Review of Systems Full 14 system review of systems performed and notable only for those listed, all others are neg:  Constitutional: Weight gain, fatigue Cardiovascular:  Ear/Nose/Throat: Hearing loss, ringing in ears Skin:  Eyes:   Respiratory:   Gastroitestinal:   Genitourinary:  Hematology/Lymphatic:   Endocrine: Feeling cold Musculoskeletal: Joint pain, joint swelling, aching muscles Allergy/Immunology: Skin sensitivity Neurological: Numbness, weakness, slurred speech, difficulty swallowing, lightheadedness Psychiatric:  Sleep:    Physical Exam  Vitals:   10/26/17 0942  BP: 101/70  Pulse: 66    General - morbid obesity, well developed, in no apparent distress.  Ophthalmologic - right eye prosthetic, left eye fundi not visualiz due to small pupiled.  Cardiovascular - Regular rate and rhythm  Mental Status -  Level of arousal and orientation to time, place, and person were intact. Language including expression, naming, repetition, comprehension, reading, and writing was assessed and found intact. Fund of Knowledge was assessed and was intact.  Cranial Nerves II - XII - II - right eye prosthetics, left eye tunnel vision, able to count fingers. III, IV, VI - Extraocular movements intact on the left  eye. V - Facial sensation intact bilaterally. VII - Facial movement intact bilaterally. VIII - Hearing & vestibular intact bilaterally. X - Palate elevates symmetrically. XI - Chin turning & shoulder shrug intact bilaterally. XII - Tongue protrusion intact.  Motor Strength - The patient's strength was symmetrically in all extremities and pronator drift was absent except pt has mild left sided lack of effort and functional component. Bulk was normal and fasciculations were absent.   Motor Tone - Muscle tone was assessed at the neck and appendages and was normal.  Reflexes - The patient's reflexes were normal in all extremities and she had no pathological reflexes.  Sensory - Light touch, temperature/pinprick were assessed and were decreased at left ulnar side, C8-T2 distribution as well as BLE L2-L4 distribution.    Coordination - The patient had normal movements in the hands and feet with no ataxia or dysmetria.  Tremor was absent.  Gait and Station - walk without devices, broad based gait with waddling gait.     Imaging none  Lab Review none  Assessment and Plan:   In summary, Mary Wilson is a 52 y.o. female with PMH of hypertension, hyperlipidemia, diabetes, "stroke in 2004", lupus, bilateral breast cancer s/p double mastectomy+chemo+Rx, right eye totally blind with prosthetics, and left eye legally blind who presents as a new patient for gait imbalance and left arm and b/l leg numbness tingling.   Patient had syncope in 2004 at home with fall and concussion, was told to have a "slight stroke" on the right side head, had left sided weakness and resolved over time. However, on MRI in 2006, there was no any evidence of previous stroke or TBI, raising concern that pt did not have stroke in 2004. Had physical decline since last year, with more falling, gait imbalance. This most likely multifactorial, including limited vision, lupus, back pain, neuropathy.   - continue current  medications - check BP at home and record. Call your PCP for medicaiton changes if BP too high or too low - follow up with  ophthalmologist for vision difficulty, rheumatologist for lupus - will do EMG/NCS for left arm and b/l leg numbness and pain  - will refer to PT for balance training and also for device helping for walking - Follow up with your primary care physician for risk factor modification. Recommend maintain blood pressure goal <130/80, diabetes with hemoglobin A1c goal below 7.0% and lipids with LDL cholesterol goal below 70 mg/dL.  - healthy diet and self exercise - avoid fall - follow up in 2 months with Janett Billow  A total of 60 minutes was spent face-to-face with this patient. Over half this time was spent on counseling patient on the physical decline, follow up with  diagnosis and different diagnostic and therapeutic options available.   Thank you very much for the opportunity to participate in the care of this patient.  Please do not hesitate to call if any questions or concerns arise.  Orders Placed This Encounter  Procedures  . Ambulatory referral to Physical Therapy    Referral Priority:   Routine    Referral Type:   Physical Medicine    Referral Reason:   Specialty Services Required    Requested Specialty:   Physical Therapy    Number of Visits Requested:   1  . NCV with EMG(electromyography)    Standing Status:   Future    Standing Expiration Date:   10/27/2018    Order Specific Question:   Where should this test be performed?    Answer:   [facility]    No orders of the defined types were placed in this encounter.   Patient Instructions  - continue current medications - check BP at home and record. Call your PCP for medicaiton changes if BP too high or too low - follow up with eye doctor for vision difficulty, rheumatologist for lupus - will do nerve conduction study for left arm and b/l leg numbness and pain  - will refer to PT for balance training and also for  device helping for walking - Follow up with your primary care physician for risk factor modification. Recommend maintain blood pressure goal <130/80, diabetes with hemoglobin A1c goal below 7.0% and lipids with LDL cholesterol goal below 70 mg/dL.  - healthy diet and self exercise - avoid fall - follow up in 2 months with Ace Gins, MD PhD [facility] [address] [place], [place] [address] [phone]

## 2017-10-27 ENCOUNTER — Other Ambulatory Visit: Payer: Self-pay | Admitting: Nurse Practitioner

## 2017-10-27 DIAGNOSIS — N6320 Unspecified lump in the left breast, unspecified quadrant: Secondary | ICD-10-CM

## 2017-11-12 ENCOUNTER — Other Ambulatory Visit: Payer: Self-pay | Admitting: Nurse Practitioner

## 2017-11-12 DIAGNOSIS — N6320 Unspecified lump in the left breast, unspecified quadrant: Secondary | ICD-10-CM

## 2017-11-18 ENCOUNTER — Encounter: Payer: Medicare Other | Admitting: Diagnostic Neuroimaging

## 2017-12-02 ENCOUNTER — Ambulatory Visit (INDEPENDENT_AMBULATORY_CARE_PROVIDER_SITE_OTHER): Payer: Medicare Other | Admitting: Diagnostic Neuroimaging

## 2017-12-02 ENCOUNTER — Encounter (INDEPENDENT_AMBULATORY_CARE_PROVIDER_SITE_OTHER): Payer: Medicare Other | Admitting: Diagnostic Neuroimaging

## 2017-12-02 DIAGNOSIS — R200 Anesthesia of skin: Secondary | ICD-10-CM | POA: Diagnosis not present

## 2017-12-02 DIAGNOSIS — Z0289 Encounter for other administrative examinations: Secondary | ICD-10-CM

## 2017-12-02 DIAGNOSIS — R202 Paresthesia of skin: Secondary | ICD-10-CM

## 2017-12-03 NOTE — Procedures (Signed)
[facility]  NCS (NERVE CONDUCTION STUDY) WITH EMG (ELECTROMYOGRAPHY) REPORT   STUDY DATE: 12/02/17 PATIENT NAME: Mary Wilson DOB: [birth year] MRN: [id number]  ORDERING CLINICIAN: Rosalin Hawking, MD PhD   TECHNOLOGIST: Oneita Jolly ELECTROMYOGRAPHER: Earlean Polka. Marcquis Ridlon, MD  CLINICAL INFORMATION: 52 year old female with numbness.  FINDINGS: NERVE CONDUCTION STUDY: Left median, left ulnar, bilateral peroneal and bilateral tibial motor responses are normal.  Left median, left ulnar, bilateral sural and bilateral superficial peroneal sensory responses are normal.  Bilateral tibial and left ulnar F wave responses are normal.   NEEDLE ELECTROMYOGRAPHY:  Needle examination of left upper extremity is normal (deltoid, biceps, triceps, flexor carpi radialis, first dorsal interosseous).    IMPRESSION:   This is a normal study.  No electrodiagnostic evidence of large fiber neuropathy.    INTERPRETING PHYSICIAN:  Penni Bombard, MD Certified in Neurology, Neurophysiology and Neuroimaging  [facility] [address] [place], [place] [address] [phone]   [facility]    Nerve / Sites Muscle Latency Ref. Amplitude Ref. Rel Amp Segments Distance Velocity Ref. Area    ms ms mV mV %  cm m/s m/s mVms  L Median - APB     Wrist APB 3.2 ?4.4 10.3 ?4.0 100 Wrist - APB 7   38.7     Upper arm APB 7.6  10.2  98.8 Upper arm - Wrist 24 55 ?49 38.3  L Ulnar - ADM     Wrist ADM 2.4 ?3.3 6.8 ?6.0 100 Wrist - ADM 7   26.2     B.Elbow ADM 6.8  6.4  93.6 B.Elbow - Wrist 22 50 ?49 24.5     A.Elbow ADM 8.6  6.1  95.6 A.Elbow - B.Elbow 10 55 ?49 23.4         A.Elbow - Wrist      R Peroneal - EDB     Ankle EDB 3.6 ?6.5 5.2 ?2.0 100 Ankle - EDB 9   14.5     Fib head EDB 10.9  4.2  81 Fib head - Ankle 37 50 ?44 12.1     Pop fossa EDB 13.3  4.2  98.8 Pop fossa - Fib head 12 51 ?44 12.4         Pop fossa - Ankle      L Peroneal - EDB     Ankle  EDB 3.9 ?6.5 5.4 ?2.0 100 Ankle - EDB 9   16.1     Fib head EDB 11.6  4.3  79.2 Fib head - Ankle 37 48 ?44 12.1     Pop fossa EDB 13.5  3.8  89.2 Pop fossa - Fib head 10 52 ?44 11.1         Pop fossa - Ankle      R Tibial - AH     Ankle AH 3.2 ?5.8 12.0 ?4.0 100 Ankle - AH 9   23.9     Pop fossa AH 12.0  9.2  77 Pop fossa - Ankle 38 43 ?41 21.8  L Tibial - AH     Ankle AH 3.4 ?5.8 9.2 ?4.0 100 Ankle - AH 9   20.4     Pop fossa AH 12.7  6.9  75.6 Pop fossa - Ankle 39 42 ?41 19.9                  SNC    Nerve / Sites Rec. Site Peak Lat Ref.  Amp Ref. Segments Distance  ms ms V V  cm  R Sural - Ankle (Calf)     Calf Ankle 3.4 ?4.4 13 ?6 Calf - Ankle 14  L Sural - Ankle (Calf)     Calf Ankle 3.2 ?4.4 10 ?6 Calf - Ankle 14  R Superficial peroneal - Ankle     Lat leg Ankle 3.5 ?4.4 7 ?6 Lat leg - Ankle 14  L Superficial peroneal - Ankle     Lat leg Ankle 3.7 ?4.4 7 ?6 Lat leg - Ankle 14  L Median - Orthodromic (Dig II, Mid palm)     Dig II Wrist 3.0 ?3.4 10 ?10 Dig II - Wrist 13  L Ulnar - Orthodromic, (Dig V, Mid palm)     Dig V Wrist 2.8 ?3.1 7 ?5 Dig V - Wrist 28                 F  Wave    Nerve F Lat Ref.   ms ms  R Tibial - AH 52.3 ?56.0  L Tibial - AH 53.1 ?56.0  L Ulnar - ADM 26.2 ?32.0           EMG full       EMG Summary Table    Spontaneous MUAP Recruitment  Muscle IA Fib PSW Fasc Other Amp Dur. Poly Pattern  L. Biceps brachii Normal None None None _______ Normal Normal Normal Normal  L. Deltoid Normal None None None _______ Normal Normal Normal Normal  L. Triceps brachii Normal None None None _______ Normal Normal Normal Normal  L. Flexor carpi radialis Normal None None None _______ Normal Normal Normal Normal  L. First dorsal interosseous Normal None None None _______ Normal Normal Normal Normal

## 2017-12-14 ENCOUNTER — Telehealth: Payer: Self-pay

## 2017-12-14 NOTE — Telephone Encounter (Signed)
-----   Message from Rosalin Hawking, MD sent at 12/13/2017  2:31 PM EDT ----- Could you please let the patient know that the nerve conduction test done recently in our office was normal, no neuropathy seen. Please continue current treatment. Thanks.  Rosalin Hawking, MD PhD Stroke Neurology 12/13/2017 2:31 PM

## 2017-12-14 NOTE — Telephone Encounter (Signed)
Notes recorded by Marval Regal, RN on 12/14/2017 at 9:44 AM EDT Left vm for patient to call back about EMG results. ------

## 2017-12-23 NOTE — Telephone Encounter (Signed)
Notes recorded by Marval Regal, RN on 12/23/2017 at 11:23 AM EDT Left vm for patient to call back about EMG/NCV test results.

## 2017-12-28 ENCOUNTER — Ambulatory Visit: Payer: Medicare Other | Admitting: Adult Health

## 2018-01-06 ENCOUNTER — Ambulatory Visit: Payer: Medicare Other | Admitting: Adult Health

## 2018-01-25 ENCOUNTER — Ambulatory Visit: Payer: Medicare Other | Admitting: Adult Health

## 2018-04-29 DIAGNOSIS — Z6841 Body Mass Index (BMI) 40.0 and over, adult: Secondary | ICD-10-CM | POA: Insufficient documentation

## 2018-04-29 DIAGNOSIS — D6859 Other primary thrombophilia: Secondary | ICD-10-CM | POA: Insufficient documentation

## 2018-04-29 DIAGNOSIS — Z86711 Personal history of pulmonary embolism: Secondary | ICD-10-CM | POA: Insufficient documentation

## 2018-06-17 DIAGNOSIS — Z86000 Personal history of in-situ neoplasm of breast: Secondary | ICD-10-CM | POA: Insufficient documentation

## 2019-01-04 ENCOUNTER — Other Ambulatory Visit: Payer: Self-pay | Admitting: Nephrology

## 2019-01-04 DIAGNOSIS — N183 Chronic kidney disease, stage 3 unspecified: Secondary | ICD-10-CM

## 2019-01-17 ENCOUNTER — Ambulatory Visit
Admission: RE | Admit: 2019-01-17 | Discharge: 2019-01-17 | Disposition: A | Discharge status: Home / Self Care | Payer: Medicare Other | Source: Ambulatory Visit | Attending: Nephrology | Admitting: Nephrology

## 2019-01-17 DIAGNOSIS — N183 Chronic kidney disease, stage 3 unspecified: Secondary | ICD-10-CM

## 2020-02-27 ENCOUNTER — Other Ambulatory Visit: Payer: Self-pay | Admitting: Nephrology

## 2020-02-27 DIAGNOSIS — N182 Chronic kidney disease, stage 2 (mild): Secondary | ICD-10-CM

## 2020-03-01 ENCOUNTER — Telehealth: Payer: Self-pay | Admitting: Dermatology

## 2020-03-01 NOTE — Telephone Encounter (Signed)
Called patient , she just needed to schedule office visit & that was already done,  Didn't need anything else

## 2020-03-01 NOTE — Telephone Encounter (Signed)
Patient is calling for a referral appointment from Dr. Simona Huh.  Patient is scheduled for 08/05/2020 at 10:30 with [name], MD.

## 2020-03-15 ENCOUNTER — Other Ambulatory Visit: Payer: Self-pay | Admitting: *Deleted

## 2020-03-15 DIAGNOSIS — R200 Anesthesia of skin: Secondary | ICD-10-CM

## 2020-03-15 DIAGNOSIS — R202 Paresthesia of skin: Secondary | ICD-10-CM

## 2020-03-15 DIAGNOSIS — R600 Localized edema: Secondary | ICD-10-CM

## 2020-04-05 ENCOUNTER — Ambulatory Visit (INDEPENDENT_AMBULATORY_CARE_PROVIDER_SITE_OTHER): Payer: Medicare Other | Admitting: Physician Assistant

## 2020-04-05 ENCOUNTER — Ambulatory Visit (HOSPITAL_COMMUNITY)
Admission: RE | Admit: 2020-04-05 | Discharge: 2020-04-05 | Disposition: A | Discharge status: Home / Self Care | Payer: Medicare Other | Source: Ambulatory Visit | Attending: Vascular Surgery | Admitting: Vascular Surgery

## 2020-04-05 ENCOUNTER — Other Ambulatory Visit: Payer: Self-pay

## 2020-04-05 VITALS — BP 113/74 | HR 66 | Temp 98.3°F | Resp 20 | Ht 67.0 in | Wt 256.4 lb

## 2020-04-05 DIAGNOSIS — R600 Localized edema: Secondary | ICD-10-CM

## 2020-04-05 DIAGNOSIS — R202 Paresthesia of skin: Secondary | ICD-10-CM | POA: Diagnosis not present

## 2020-04-05 DIAGNOSIS — I872 Venous insufficiency (chronic) (peripheral): Secondary | ICD-10-CM | POA: Diagnosis not present

## 2020-04-05 DIAGNOSIS — R200 Anesthesia of skin: Secondary | ICD-10-CM

## 2020-04-05 NOTE — Progress Notes (Signed)
Requested by:  Simona Huh, NP [address] [place],  [place] [address]  Reason for consultation: bilateral lower extremity edema    History of Present Illness   Mary Wilson is a 54 y.o. (11-20-1965) female who presents for evaluation of bilateral lower extremity edema. Patient states she has had this issue for several years but within the last year it has become worse. She describes aching and heaviness in her legs. Her legs also get very tired on ambulation. She gets sticking/ stinging sensations throughout her legs as she walks as well as swelling. She says that standing or prolonged ambulation make her legs worse or if she gets cold. Warm water or heat on her legs and massage seem to be alleviating. She has tried elevation and compression but she has not seen much benefit. She does say that if she wears some tight leggings that she feels like her legs feel better and are more supported. She  Does not have any personal history of DVT. She says that her mother has varicose veins and that both her parents have had trouble with "blood clots". Says that her dad had his leg amputated due to "poor circulation".   She describes a remote history as a child of having bilateral surgery for her legs to fix her bowed legs as well as realign the nerves and blood flow. She said this procedure was done in [place] and she is not exactly sure what the procedure was but she has scars on both of her knees from the surgery  Venous symptoms include: aching, heavy, tired, stinging, swelling Onset/duration: several years, worse over past year Occupation: Environmental consultant professor Aggravating factors: sitting, standing, cold Alleviating factors: warm water/ compresses, massage Compression:  yes Helps: minimally Pain medications: Ibuprofen Previous vein procedures: none History of DVT:  no  Past Medical History:  Diagnosis Date  . Asthma   . CVA (cerebral infarction)   . Diabetes mellitus without  complication ([facility])    now under control; no meds as of 04/26/16  . Esophageal reflux   . 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 ([facility])   . Hodgkin disease ([facility])   . Hodgkin's disease ([facility])   . HTN (hypertension)   . Hyperlipidemia   . Intractable migraine   . Legally blind   . Lupus erythematosus   . Muscle spasticity   . Stroke ([facility])   . Vision abnormalities   . Vitamin D deficiency     Past Surgical History:  Procedure Laterality Date  . ABDOMINAL HYSTERECTOMY    . MASTECTOMY     bilat    Social History   Socioeconomic History  . Marital status: Married    Spouse name: Not on file  . Number of children: 2  . Years of education: Not on file  . Highest education level: Not on file  Occupational History  . Not on file  Tobacco Use  . Smoking status: Former Smoker    Quit date: 10/27/2014    Years since quitting: 5.4  . Smokeless tobacco: Never Used  Substance and Sexual Activity  . Alcohol use: No  . Drug use: No  . Sexual activity: Not on file  Other Topics Concern  . Not on file  Social History Narrative  . Not on file   Social Determinants of Health   Financial Resource Strain:   . Difficulty of Paying Living Expenses: Not on file  Food Insecurity:   . Worried  About Running Out of Food in the Last Year: Not on file  . Ran Out of Food in the Last Year: Not on file  Transportation Needs:   . Lack of Transportation (Medical): Not on file  . Lack of Transportation (Non-Medical): Not on file  Physical Activity:   . Days of Exercise per Week: Not on file  . Minutes of Exercise per Session: Not on file  Stress:   . Feeling of Stress : Not on file  Social Connections:   . Frequency of Communication with Friends and Family: Not on file  . Frequency of Social Gatherings with Friends and Family: Not on file  . Attends Religious Services: Not on file  . Active Member of Clubs or Organizations: Not on file  . Attends Theatre manager Meetings: Not on file  . Marital Status: Not on file  Intimate Partner Violence:   . Fear of Current or Ex-Partner: Not on file  . Emotionally Abused: Not on file  . Physically Abused: Not on file  . Sexually Abused: Not on file   No family history on file.  Current Outpatient Medications  Medication Sig Dispense Refill  . Acetaminophen (MAPAP) 500 MG coapsule Take by mouth.    Marland Kitchen albuterol (PROVENTIL HFA) 108 (90 Base) MCG/ACT inhaler Inhale into the lungs.    Marland Kitchen atorvastatin (LIPITOR) 40 MG tablet Take 40 mg by mouth daily.    . baclofen (LIORESAL) 20 MG tablet Take 20 mg by mouth 3 (three) times daily.    . cholecalciferol (VITAMIN D) 1000 units tablet Take by mouth.    . esomeprazole (NEXIUM) 40 MG capsule Take 40 mg by mouth daily before breakfast.    . folic acid (FOLVITE) 967 MCG tablet Take by mouth.    . hydroxychloroquine (PLAQUENIL) 200 MG tablet Take by mouth.    Marland Kitchen ibuprofen (ADVIL,MOTRIN) 800 MG tablet Take 800 mg by mouth every 8 (eight) hours as needed.    . loratadine (CLARITIN) 10 MG tablet Take 10 mg by mouth daily.    Marland Kitchen losartan (COZAAR) 25 MG tablet Take 25 mg by mouth 2 (two) times daily.    . methotrexate 50 MG/2ML injection Inject into the vein.    . metoprolol succinate (TOPROL-XL) 100 MG 24 hr tablet Take 100 mg by mouth daily. Take with or immediately following a meal.    . Multiple Vitamin (MULTIVITAMIN) capsule Take by mouth.    Marland Kitchen omeprazole (PRILOSEC) 20 MG capsule Take 20 mg by mouth daily.    . polyethylene glycol powder (GLYCOLAX/MIRALAX) powder MIX 17 GRAMS IN LIQUID AND DRINK DAILY AS NEEDED    . tiZANidine (ZANAFLEX) 2 MG tablet Take by mouth at bedtime.    . traZODone (DESYREL) 150 MG tablet Take by mouth at bedtime.    Marland Kitchen umeclidinium-vilanterol (ANORO ELLIPTA) 62.5-25 MCG/INH AEPB Inhale 1 puff into the lungs daily.    . valsartan-hydrochlorothiazide (DIOVAN-HCT) 320-12.5 MG tablet TAKE 1 TABLET EVERY DAY    . vitamin B-12  (CYANOCOBALAMIN) 1000 MCG tablet Take by mouth.    . Vitamin E 400 units TABS Take by mouth.     No current facility-administered medications for this visit.    Allergies  Allergen Reactions  . Erythromycin Nausea And Vomiting and Shortness Of Breath  . Penicillins Anaphylaxis  . Sulfa Antibiotics Anaphylaxis and Nausea And Vomiting  . Morphine Other (See Comments)    insomnia  . Tetracyclines & Related Nausea And Vomiting  . Morphine And Related Other (  See Comments)    Stop breathing    REVIEW OF SYSTEMS (negative unless checked):   Cardiac:  []  Chest pain or chest pressure? []  Shortness of breath upon activity? []  Shortness of breath when lying flat? []  Irregular heart rhythm?  Vascular:  []  Pain in calf, thigh, or hip brought on by walking? []  Pain in feet at night that wakes you up from your sleep? []  Blood clot in your veins? [x]  Leg swelling?  Pulmonary:  []  Oxygen at home? []  Productive cough? []  Wheezing?  Neurologic:  []  Sudden weakness in arms or legs? []  Sudden numbness in arms or legs? []  Sudden onset of difficult speaking or slurred speech? []  Temporary loss of vision in one eye? []  Problems with dizziness?  Gastrointestinal:  []  Blood in stool? []  Vomited blood?  Genitourinary:  []  Burning when urinating? []  Blood in urine?  Psychiatric:  []  Major depression  Hematologic:  []  Bleeding problems? []  Problems with blood clotting?  Dermatologic:  []  Rashes or ulcers?  Constitutional:  []  Fever or chills?  Ear/Nose/Throat:  []  Change in hearing? []  Nose bleeds? []  Sore throat?  Musculoskeletal:  []  Back pain? []  Joint pain? []  Muscle pain?   Physical Examination     Vitals:   04/05/20 1253  BP: 113/74  Pulse: 66  Resp: 20  Temp: 98.3 F (36.8 C)  TempSrc: Temporal  SpO2: 97%  Weight: 256 lb 6.4 oz (116.3 kg)  Height: 5\' 7"  (1.702 m)   Body mass index is 40.16 kg/m.  General:  WDWN in NAD; vital signs documented  above Gait: Normal HENT: WNL, normocephalic Pulmonary: normal non-labored breathing , without wheezing Cardiac: regular HR, without  Murmurs without carotid bruit Vascular Exam/Pulses:  Right Left  Radial 2+ (normal) 2+ (normal)  Femoral 2+ (normal) 2+ (normal)  Popliteal 2+ (normal) 2+ (normal)  DP 2+ (normal) 2+ (normal)  PT 2+ (normal) 2+ (normal)   Extremities: without varicose veins, without reticular veins, without edema, without stasis pigmentation, without lipodermatosclerosis, without ulcers Musculoskeletal: no muscle wasting or atrophy  Neurologic: A&O X 3;  No focal weakness or paresthesias are detected Psychiatric:  The pt has Normal affect.  Non-invasive Vascular Imaging   BLE Venous Insufficiency Duplex (04/05/20):   RLE:   No DVT and SVT,   GSV reflux in proximal calf, proximal thigh and SFJ  GSV diameter 0.13-1.03  SSV reflux popliteal fossa and proximal calf  CFV deep venous reflux   LLE:  No DVT and SVT   No GSV reflux   GSV diameter 0.22-0.57  No SSV reflux   CFV, FV, popliteal deep venous reflux   Medical Decision Making   Mary Wilson is a 54 y.o. female who presents with: BLE chronic venous insufficiency. Duplex evaluation today shows deep and superficial reflux of the right lower extremity but her veins are small. Her left lower extremity she just has deep reflux but no superficial reflux.   Based on the patient's history and examination, I recommend conservative therapy with exercise, elevation, weight reduction and compression stockings  I discussed with the patient the use of her 20-30 mm thigh high compression stockings   She is not a candidate for venous ablation or other venous procedures based on her duplex findings today  She will follow up as needed or if she develops new or worsening symptoms   Karoline Caldwell, PA-C [facility]: [phone]  04/05/2020, 1:36 PM  Clinic  MD: Dr. Donzetta Matters

## 2020-07-04 ENCOUNTER — Other Ambulatory Visit: Payer: Medicare Other

## 2020-08-05 ENCOUNTER — Ambulatory Visit (INDEPENDENT_AMBULATORY_CARE_PROVIDER_SITE_OTHER): Payer: Medicare Other | Admitting: Dermatology

## 2020-08-05 ENCOUNTER — Other Ambulatory Visit: Payer: Self-pay

## 2020-08-05 ENCOUNTER — Encounter: Payer: Self-pay | Admitting: Dermatology

## 2020-08-05 DIAGNOSIS — L700 Acne vulgaris: Secondary | ICD-10-CM

## 2020-08-05 MED ORDER — METRONIDAZOLE 250 MG PO TABS: ORAL_TABLET | ORAL | 5 refills | Status: DC

## 2020-08-05 NOTE — Patient Instructions (Signed)
  Over the counter- Neutragena Rapid Clear

## 2020-08-11 ENCOUNTER — Encounter: Payer: Self-pay | Admitting: Dermatology

## 2020-08-11 NOTE — Progress Notes (Signed)
   New Patient   Subjective  Elveria Lauderbaugh is a 55 y.o. female who presents for the following: New Patient (Initial Visit) (Patient here today for acne on face x 2-3 years patient has tried OTC treatments. Non of the OTC treatments help only washing with plain water helps.).  Acne with dark spots Location: Dominantly face Duration:  Quality:  Associated Signs/Symptoms: Modifying Factors:  Severity:  Timing: Context: Multiple drug sensitivities.  Diagnosis of lupus mentioned in history.   The following portions of the chart were reviewed this encounter and updated as appropriate:  Tobacco  Allergies  Meds  Problems  Med Hx  Surg Hx  Fam Hx      Objective  Well appearing patient in no apparent distress; mood and affect are within normal limits. Objective  Mid Forehead: Multiple inflammatory papules some deep, predominantly face.  Significant postinflammatory hyperpigmentation.  Multiple antibiotic allergies/intolerances, but I do not expect we can control this with just topical therapy.  I did discuss Accutane with her in some detail.   A focused examination was performed including Head and neck.. Relevant physical exam findings are noted in the Assessment and Plan.   Assessment & Plan  Acne vulgaris Mid Forehead  Oral metronidazole 250 mg 1 daily with food.  Avoid alcohol.  Warned of bad taste with possible GI upset.  Will consider isotretinoin if needed.  metroNIDAZOLE (FLAGYL) 250 MG tablet - Mid Forehead

## 2020-08-16 NOTE — Progress Notes (Signed)
I, [name], MD, have reviewed all documentation for this visit. The documentation on 08/16/20 for the exam, diagnosis, procedures, and orders are all accurate and complete.

## 2020-08-28 ENCOUNTER — Ambulatory Visit
Admission: RE | Admit: 2020-08-28 | Discharge: 2020-08-28 | Disposition: A | Discharge status: Home / Self Care | Payer: Medicare Other | Source: Ambulatory Visit | Attending: Nephrology | Admitting: Nephrology

## 2020-08-28 DIAGNOSIS — N182 Chronic kidney disease, stage 2 (mild): Secondary | ICD-10-CM

## 2020-09-24 ENCOUNTER — Encounter: Payer: Self-pay | Admitting: Dermatology

## 2020-09-24 ENCOUNTER — Ambulatory Visit (INDEPENDENT_AMBULATORY_CARE_PROVIDER_SITE_OTHER): Payer: BC Managed Care – PPO | Admitting: Dermatology

## 2020-09-24 ENCOUNTER — Other Ambulatory Visit: Payer: Self-pay

## 2020-09-24 DIAGNOSIS — L700 Acne vulgaris: Secondary | ICD-10-CM

## 2020-09-24 NOTE — Patient Instructions (Addendum)
Edrick Oh Md 3.2 46 Google reviews Obstetrician-gynecologist in [place], [place] COVID-19 info: Learn more Get online care: [email or web address] Address: [address] [place] [address] Hours:  Open ? Closes 6PM  [phone]

## 2020-10-05 ENCOUNTER — Encounter: Payer: Self-pay | Admitting: Dermatology

## 2020-10-05 NOTE — Progress Notes (Signed)
   Follow-Up Visit   Subjective  Othello Sgroi is a 55 y.o. female who presents for the following: Follow-up (Patient here today for 6 week follow up for acne per patient no improvement with current treatment of Metronidazole. Per patient she is still breaking out.).  Cystic acne Location:  Duration:  Quality:  Associated Signs/Symptoms: Modifying Factors:  Severity:  Timing: Context:   Objective  Well appearing patient in no apparent distress; mood and affect are within normal limits. Objective  Head - Anterior (Face): Cystic acne with postinflammatory hyperpigmentation.  No response to conservative therapy.  Patient most interested in beginning isotretinoin.  All risks detailed.   POST HYSTERECTOMY  Edrick Oh Md 3.2 46 [employer] reviews Obstetrician-gynecologist in [place], [place] COVID-19 info: Learn more Get online care: [email or web address] Address: [address] [place] [address]  [phone]                  Hours:  Open ? Closes 6PM  257 LBS NEW START ISOTRET 40 MG       Follow-Up Visit   Subjective  Honest Safranek is a 55 y.o. female who presents for the following: Follow-up (Patient here today for 6 week follow up for acne per patient no improvement with current treatment of Metronidazole. Per patient she is still breaking out.).  Cystic acne mostly face, heels with dark spots Location:  Duration:  Quality:  Associated Signs/Symptoms: Modifying Factors:  Severity:  Timing: Context:   Objective  Well appearing patient in no apparent distress; mood and affect are within normal limits. Objective  Head - Anterior (Face): Cystic acne with postinflammatory hyperpigmentation.  No response to conservative therapy.  Patient most interested in beginning isotretinoin.  All risks detailed.   POST HYSTERECTOMY  Edrick Oh Md 3.2 46 Google reviews Obstetrician-gynecologist in [place],  [place] COVID-19 info: Learn more Get online care: [email or web address] Address: [address] [place] [address]  [phone]                  Hours:  Open ? Closes 6PM  257 LBS NEW START ISOTRET 40 MG     A focused examination was performed including Head and neck.. Relevant physical exam findings are noted in the Assessment and Plan.   Assessment & Plan    Acne vulgaris Head - Anterior (Face)  PATIENT WILL NEED NURSE VISIT TO START IPLEDGE WHEN THE DOCUMENTS ARE PRESENT FOR MEDICAL HYSTERECTOMY. NO TREATMENT STARTED TODAY (SEE ALL PATIENTS ALLERGIES)   Other Related Medications metroNIDAZOLE (FLAGYL) 250 MG tablet      I, [name], MD, have reviewed all documentation for this visit.  The documentation on 10/05/20 for the exam, diagnosis, procedures, and orders are all accurate and complete.   Assessment & Plan    Acne vulgaris Head - Anterior (Face)  PATIENT WILL NEED NURSE VISIT TO START IPLEDGE WHEN THE DOCUMENTS ARE PRESENT FOR MEDICAL HYSTERECTOMY. NO TREATMENT STARTED TODAY (SEE ALL PATIENTS ALLERGIES)   Other Related Medications metroNIDAZOLE (FLAGYL) 250 MG tablet      I, [name], MD, have reviewed all documentation for this visit.  The documentation on 10/05/20 for the exam, diagnosis, procedures, and orders are all accurate and complete.

## 2020-10-07 ENCOUNTER — Telehealth: Payer: Self-pay

## 2020-10-07 NOTE — Telephone Encounter (Signed)
Phone call to patient to inform her that we did receive her records regarding her hysterectomy and to schedule her a nurse to see appointment to start Isotretinoin if she's still interested.  Voicemail left for patient to give the office a call back.

## 2020-10-07 NOTE — Telephone Encounter (Signed)
Fax received from [facility] with patient's hysterectomy results.

## 2020-10-08 ENCOUNTER — Telehealth: Payer: Self-pay | Admitting: Dermatology

## 2020-10-08 NOTE — Telephone Encounter (Signed)
Returning call about getting into program for acne +thinks she needs to come in for bloodwork.

## 2020-10-08 NOTE — Telephone Encounter (Signed)
We received patient records for her hysterectomy so needs nurse to see to sign up for isotretinoin.

## 2020-10-09 ENCOUNTER — Ambulatory Visit (INDEPENDENT_AMBULATORY_CARE_PROVIDER_SITE_OTHER): Payer: Medicare Other | Admitting: *Deleted

## 2020-10-09 ENCOUNTER — Other Ambulatory Visit: Payer: Self-pay

## 2020-10-09 VITALS — Wt 245.0 lb

## 2020-10-09 DIAGNOSIS — L700 Acne vulgaris: Secondary | ICD-10-CM

## 2020-10-09 NOTE — Progress Notes (Addendum)
Labs reviewed on Thursday 10/10/20 by dr tafeen and verbal order given to send in isotretinoin 40mg  - sent to patient pharmacy.   Here to sign up for isotretinoin- unable to sign up at last office visit because we were awaiting her hysterectomy records from obgyn.  We have since  received those documents scanned them into patient chart and signed her up for isotretinoin per dr tafeen.

## 2020-10-10 LAB — COMPREHENSIVE METABOLIC PANEL
AG Ratio: 1.1 (calc) (ref 1.0–2.5)
ALT: 21 U/L (ref 6–29)
AST: 19 U/L (ref 10–35)
Albumin: 3.8 g/dL (ref 3.6–5.1)
Alkaline phosphatase (APISO): 88 U/L (ref 37–153)
BUN: 12 mg/dL (ref 7–25)
CO2: 26 mmol/L (ref 20–32)
Calcium: 9.3 mg/dL (ref 8.6–10.4)
Chloride: 107 mmol/L (ref 98–110)
Creat: 1.01 mg/dL (ref 0.50–1.05)
Globulin: 3.4 g/dL (calc) (ref 1.9–3.7)
Glucose, Bld: 105 mg/dL — ABNORMAL HIGH (ref 65–99)
Potassium: 4.3 mmol/L (ref 3.5–5.3)
Sodium: 138 mmol/L (ref 135–146)
Total Bilirubin: 0.3 mg/dL (ref 0.2–1.2)
Total Protein: 7.2 g/dL (ref 6.1–8.1)

## 2020-10-10 LAB — CBC WITH DIFFERENTIAL/PLATELET
Absolute Monocytes: 720 cells/uL (ref 200–950)
Basophils Absolute: 29 cells/uL (ref 0–200)
Basophils Relative: 0.3 %
Eosinophils Absolute: 96 cells/uL (ref 15–500)
Eosinophils Relative: 1 %
HCT: 45.5 % — ABNORMAL HIGH (ref 35.0–45.0)
Hemoglobin: 14.6 g/dL (ref 11.7–15.5)
Lymphs Abs: 2237 cells/uL (ref 850–3900)
MCH: 27.7 pg (ref 27.0–33.0)
MCHC: 32.1 g/dL (ref 32.0–36.0)
MCV: 86.3 fL (ref 80.0–100.0)
MPV: 10.7 fL (ref 7.5–12.5)
Monocytes Relative: 7.5 %
Neutro Abs: 6518 cells/uL (ref 1500–7800)
Neutrophils Relative %: 67.9 %
Platelets: 265 10*3/uL (ref 140–400)
RBC: 5.27 10*6/uL — ABNORMAL HIGH (ref 3.80–5.10)
RDW: 13.9 % (ref 11.0–15.0)
Total Lymphocyte: 23.3 %
WBC: 9.6 10*3/uL (ref 3.8–10.8)

## 2020-10-10 LAB — LIPID PANEL
Cholesterol: 199 mg/dL (ref <200)
HDL: 61 mg/dL (ref >=50)
LDL Cholesterol (Calc): 116 mg/dL (calc) — ABNORMAL HIGH
Non-HDL Cholesterol (Calc): 138 mg/dL (calc) — ABNORMAL HIGH (ref <130)
Total CHOL/HDL Ratio: 3.3 (calc) (ref <5.0)
Triglycerides: 109 mg/dL (ref <150)

## 2020-10-10 MED ORDER — ISOTRETINOIN 40 MG PO CAPS: 40.0000 mg | ORAL_CAPSULE | Freq: Every day | ORAL | 0 refills | Status: AC

## 2020-10-10 NOTE — Addendum Note (Signed)
Addended by: Zenia Resides on: 10/10/2020 07:18 AM   Modules accepted: Orders

## 2020-10-14 ENCOUNTER — Telehealth: Payer: Self-pay | Admitting: Dermatology

## 2020-10-14 NOTE — Telephone Encounter (Signed)
Patient left message on office voice mail that [facility] [address] in [place], [place] not have her Isotretnoin prescription and wants to make sure it has been sent in.

## 2020-10-14 NOTE — Telephone Encounter (Signed)
Phone call to patient to inform her that the prescription was sent to her Pharmacy on 10/10/2020.  Voicemail left for patient to give the office a call back.

## 2020-10-14 NOTE — Telephone Encounter (Addendum)
PA for Isotretinoin approved.   Your PA request has been approved. Additional information will be provided in the approval communication.

## 2020-10-14 NOTE — Telephone Encounter (Signed)
PA for Isotretinoin done through [facility].    Your information has been submitted to [place]. To check for an updated outcome later, reopen this PA request from your dashboard.  If [employer] has not responded to your request within 24 hours, contact [place] at [phone]. If you think there may be a problem with your PA request, use our live chat feature at the bottom right.

## 2020-10-14 NOTE — Telephone Encounter (Signed)
Fax received from [facility] requesting a PA for the patient's Isotretinoin.

## 2020-10-15 ENCOUNTER — Telehealth: Payer: Self-pay | Admitting: Dermatology

## 2020-10-15 NOTE — Telephone Encounter (Signed)
Possible side effect of stomach problem from accutane; she has ibs. Wants to talk to nurse about it

## 2020-10-15 NOTE — Telephone Encounter (Signed)
Phone call to patient to let her know that the Accutane is probably not causing her stomach issues but u would ask Dr.Tafeen. Patient is being seen tomorrow and will ask him at visit as well.

## 2020-10-15 NOTE — Telephone Encounter (Signed)
I have not personally seen nor do I recall reading that the incidence of stomach problems with her medication is more than with a placebo therapy, including in patients with history of IBS.  Nonetheless, why not stop the medicine for 4 days and if her abdominal symptoms are improved, restart the isotretinoin as 1 pill every other day for 10 days and then call us with a status report.

## 2020-10-16 ENCOUNTER — Ambulatory Visit (INDEPENDENT_AMBULATORY_CARE_PROVIDER_SITE_OTHER): Payer: BC Managed Care – PPO | Admitting: Dermatology

## 2020-10-16 ENCOUNTER — Other Ambulatory Visit: Payer: Self-pay

## 2020-10-16 ENCOUNTER — Encounter: Payer: Self-pay | Admitting: Dermatology

## 2020-10-16 DIAGNOSIS — L669 Cicatricial alopecia, unspecified: Secondary | ICD-10-CM

## 2020-10-16 DIAGNOSIS — L659 Nonscarring hair loss, unspecified: Secondary | ICD-10-CM

## 2020-10-16 DIAGNOSIS — L700 Acne vulgaris: Secondary | ICD-10-CM | POA: Diagnosis not present

## 2020-10-16 MED ORDER — CLOBETASOL PROPIONATE 0.05 % EX FOAM: Freq: Two times a day (BID) | CUTANEOUS | 3 refills | Status: DC

## 2020-10-16 NOTE — Telephone Encounter (Signed)
Spoke with nurse at today's office visit.

## 2020-10-28 ENCOUNTER — Encounter: Payer: Self-pay | Admitting: Dermatology

## 2020-10-28 NOTE — Progress Notes (Signed)
   Follow-Up Visit   Subjective  Mary Wilson is a 55 y.o. female who presents for the following: Alopecia (Patient has noticed hairloss x 1 year- tx- none).  The focus for today is on Mary Wilson is 1 year history of hair loss rather than her acne and the isotretinoin on. Location:  Duration:  Quality:  Associated Signs/Symptoms: Modifying Factors:  Severity:  Timing: Context:   Objective  Well appearing patient in no apparent distress; mood and affect are within normal limits. Objective  Scalp: Profound loss of hair density extending from the front to the crown with follicular dropout but no pustules, vesicles, or obvious edema or scarring.  No changes in ear or mucous membrane.  Nailbeds normal.  She does report symptomatic stinging or itching.  There is no family history of anything similar.  Denies cold sensitivity, sun sensitivity, arthritis.  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  We discussed both obtaining biopsies and consultation from Dr. Lois Huxley in the future; we may schedule for this consultation and cancel that if she has any significant clinical improvement.  Images    Objective  Head - Anterior (Face): There had been some question about a side effect from her first month of isotretinoin which has resolved, so there are currently no issues with her continuing therapy.    A focused examination was performed including Scalp and face and ears hands and nailbeds.. Relevant physical exam findings are noted in the Assessment and Plan.   Assessment & Plan    Cicatricial alopecia Scalp  Clobetasol foam applied daily to the area with hair loss for the next 10 weeks.  Avoid use on face and body folds.  If there is no improvement we will likely sequentially add minocycline (which I prefer avoiding while she is on isotretinoin) and if needed after this,  perhaps Plaquenil.  clobetasol (OLUX) 0.05 % topical foam - Scalp  Acne vulgaris Head - Anterior (Face)  Continue isotretinoin.  Other Related Medications metroNIDAZOLE (FLAGYL) 250 MG tablet      I, Mary Monarch, MD, have reviewed all documentation for this visit.  The documentation on 10/28/20 for the exam, diagnosis, procedures, and orders are all accurate and complete.

## 2020-11-12 ENCOUNTER — Ambulatory Visit: Payer: Medicare Other | Admitting: Dermatology

## 2020-11-12 ENCOUNTER — Encounter: Payer: Self-pay | Admitting: Physician Assistant

## 2020-11-12 ENCOUNTER — Telehealth: Payer: Self-pay

## 2020-11-12 ENCOUNTER — Ambulatory Visit (INDEPENDENT_AMBULATORY_CARE_PROVIDER_SITE_OTHER): Payer: BC Managed Care – PPO | Admitting: Physician Assistant

## 2020-11-12 ENCOUNTER — Other Ambulatory Visit: Payer: Self-pay

## 2020-11-12 DIAGNOSIS — Z5181 Encounter for therapeutic drug level monitoring: Secondary | ICD-10-CM | POA: Diagnosis not present

## 2020-11-12 DIAGNOSIS — L700 Acne vulgaris: Secondary | ICD-10-CM | POA: Diagnosis not present

## 2020-11-12 MED ORDER — ISOTRETINOIN 30 MG PO CAPS: 30.0000 mg | ORAL_CAPSULE | Freq: Every day | ORAL | 0 refills | Status: DC

## 2020-11-12 NOTE — Telephone Encounter (Signed)
Prior authorization for patient's Claravis 30mg  done through Cover [employer] Meds.    This request has received a Favorable outcome.  Please note any additional information provided by [employer] at the bottom of your screen.

## 2020-11-12 NOTE — Progress Notes (Signed)
   Follow-Up Visit   Subjective  Mary Wilson is a 55 y.o. female who presents for the following: Acne (Patient here today for 31 day follow up, patient is currently on 40 mg of Isotretinoin.). She expressed side effect concerns regarding fatigue and mood swings. Denies suicidal ideation. She is happy that her acne is getting better. We discussed the side effects and she is comfortable with decreasing the dose to see if side effects are better.   The following portions of the chart were reviewed this encounter and updated as appropriate:  Tobacco  Allergies  Meds  Problems  Med Hx  Surg Hx  Fam Hx         Objective  Well appearing patient in no apparent distress; mood and affect are within normal limits.  A focused examination was performed including face. Relevant physical exam findings are noted in the Assessment and Plan.  Objective  Head - Anterior (Face): Post inflammatory hyperpigmentation both cheeks. No active lesions.   Assessment & Plan  Acne vulgaris Head - Anterior (Face)  Patient unable to go to lab today- per k.Bibi Economos- patient just needs labs done before next office visit.   CBC with Differential/Platelet - Head - Anterior (Face)  Comprehensive metabolic panel - Head - Anterior (Face)  Lipid panel - Head - Anterior (Face)  ISOtretinoin (ACCUTANE) 30 MG capsule - Head - Anterior (Face)  Medication monitoring encounter  Other Related Procedures CBC with Differential/Platelet Comprehensive metabolic panel Lipid panel    I, Nehemiah Montee, PA-C, have reviewed all documentation's for this visit.  The documentation on 11/12/20 for the exam, diagnosis, procedures and orders are all accurate and complete.

## 2020-11-12 NOTE — Telephone Encounter (Signed)
Fax received from patient's Pharmacy requesting a prior authorization for the patient's Claravis 30mg .

## 2020-12-17 ENCOUNTER — Other Ambulatory Visit: Payer: Self-pay

## 2020-12-17 ENCOUNTER — Other Ambulatory Visit: Payer: Self-pay | Admitting: Dermatology

## 2020-12-17 ENCOUNTER — Ambulatory Visit (INDEPENDENT_AMBULATORY_CARE_PROVIDER_SITE_OTHER): Payer: BC Managed Care – PPO | Admitting: Dermatology

## 2020-12-17 VITALS — Wt 245.0 lb

## 2020-12-17 DIAGNOSIS — L669 Cicatricial alopecia, unspecified: Secondary | ICD-10-CM | POA: Diagnosis not present

## 2020-12-17 DIAGNOSIS — L700 Acne vulgaris: Secondary | ICD-10-CM | POA: Diagnosis not present

## 2020-12-17 MED ORDER — ISOTRETINOIN 30 MG PO CAPS: 30.0000 mg | ORAL_CAPSULE | Freq: Every day | ORAL | 0 refills | Status: DC

## 2020-12-17 MED ORDER — CLOBETASOL PROPIONATE 0.05 % EX FOAM: Freq: Two times a day (BID) | CUTANEOUS | 3 refills | Status: DC

## 2020-12-18 LAB — COMPREHENSIVE METABOLIC PANEL
AG Ratio: 1 (calc) (ref 1.0–2.5)
ALT: 16 U/L (ref 6–29)
AST: 22 U/L (ref 10–35)
Albumin: 3.5 g/dL — ABNORMAL LOW (ref 3.6–5.1)
Alkaline phosphatase (APISO): 83 U/L (ref 37–153)
BUN/Creatinine Ratio: 15 (calc) (ref 6–22)
BUN: 16 mg/dL (ref 7–25)
CO2: 28 mmol/L (ref 20–32)
Calcium: 9.3 mg/dL (ref 8.6–10.4)
Chloride: 108 mmol/L (ref 98–110)
Creat: 1.07 mg/dL — ABNORMAL HIGH (ref 0.50–1.05)
Globulin: 3.5 g/dL (calc) (ref 1.9–3.7)
Glucose, Bld: 79 mg/dL (ref 65–99)
Potassium: 3.6 mmol/L (ref 3.5–5.3)
Sodium: 141 mmol/L (ref 135–146)
Total Bilirubin: 0.4 mg/dL (ref 0.2–1.2)
Total Protein: 7 g/dL (ref 6.1–8.1)

## 2020-12-18 LAB — CBC WITH DIFFERENTIAL/PLATELET
Absolute Monocytes: 731 cells/uL (ref 200–950)
Basophils Absolute: 43 cells/uL (ref 0–200)
Basophils Relative: 0.5 %
Eosinophils Absolute: 153 cells/uL (ref 15–500)
Eosinophils Relative: 1.8 %
HCT: 42.2 % (ref 35.0–45.0)
Hemoglobin: 13.9 g/dL (ref 11.7–15.5)
Lymphs Abs: 2933 cells/uL (ref 850–3900)
MCH: 28 pg (ref 27.0–33.0)
MCHC: 32.9 g/dL (ref 32.0–36.0)
MCV: 84.9 fL (ref 80.0–100.0)
MPV: 10.8 fL (ref 7.5–12.5)
Monocytes Relative: 8.6 %
Neutro Abs: 4641 cells/uL (ref 1500–7800)
Neutrophils Relative %: 54.6 %
Platelets: 310 10*3/uL (ref 140–400)
RBC: 4.97 10*6/uL (ref 3.80–5.10)
RDW: 14.2 % (ref 11.0–15.0)
Total Lymphocyte: 34.5 %
WBC: 8.5 10*3/uL (ref 3.8–10.8)

## 2020-12-18 LAB — LIPID PANEL
Cholesterol: 150 mg/dL (ref <200)
HDL: 49 mg/dL — ABNORMAL LOW (ref >=50)
LDL Cholesterol (Calc): 77 mg/dL (calc)
Non-HDL Cholesterol (Calc): 101 mg/dL (calc) (ref <130)
Total CHOL/HDL Ratio: 3.1 (calc) (ref <5.0)
Triglycerides: 140 mg/dL (ref <150)

## 2020-12-30 ENCOUNTER — Encounter: Payer: Self-pay | Admitting: Dermatology

## 2020-12-30 NOTE — Progress Notes (Signed)
   Follow-Up Visit   Subjective  Mary Wilson is a 55 y.o. female who presents for the following: Acne (Here for isotretinoin follow up. ).  A little cystic acne Location:  Duration:  Quality: Improving Associated Signs/Symptoms: Modifying Factors: History of Severity:  Timing: Context:   Objective  Well appearing patient in no apparent distress; mood and affect are within normal limits. Head - Anterior (Face) 2 months into therapy with reasonable tolerance.  Perhaps 50% reduction in deep inflammatory papules.  Moderate PIH.    A focused examination was performed including head and neck. Relevant physical exam findings are noted in the Assessment and Plan.   Assessment & Plan    Acne vulgaris Head - Anterior (Face)  Continue low-dose isotretinoin, 30 mg daily.  All side effects reviewed.  Recheck 1 month.  Related Procedures CBC with Differential/Platelet Comprehensive metabolic panel Lipid panel  Related Medications ISOtretinoin (ACCUTANE) 30 MG capsule Take 1 capsule (30 mg total) by mouth daily.  Cicatricial alopecia  Related Medications clobetasol (OLUX) 0.05 % topical foam Apply topically 2 (two) times daily.      I, [name], MD, have reviewed all documentation for this visit.  The documentation on 12/30/20 for the exam, diagnosis, procedures, and orders are all accurate and complete.

## 2021-01-21 ENCOUNTER — Ambulatory Visit: Payer: Medicare Other | Admitting: Physician Assistant

## 2021-01-27 ENCOUNTER — Ambulatory Visit: Payer: Medicare Other | Admitting: Dermatology

## 2021-02-15 ENCOUNTER — Ambulatory Visit
Admission: EM | Admit: 2021-02-15 | Discharge: 2021-02-15 | Disposition: A | Discharge status: Home / Self Care | Payer: Medicare Other | Attending: Internal Medicine | Admitting: Internal Medicine

## 2021-02-15 ENCOUNTER — Encounter: Payer: Self-pay | Admitting: Emergency Medicine

## 2021-02-15 ENCOUNTER — Other Ambulatory Visit: Payer: Self-pay

## 2021-02-15 DIAGNOSIS — H538 Other visual disturbances: Secondary | ICD-10-CM

## 2021-02-15 NOTE — ED Provider Notes (Signed)
[facility]    CSN: [id number] Arrival date & time: 02/15/21  1104      History   Chief Complaint No chief complaint on file.   HPI Mary Wilson [place] is a 55 y.o. female with 1 functioning eye comes to urgent care with blurry vision out of the left eye of 1 day duration.  Symptoms onset was fairly rapid and has been persistent since yesterday.  It is associated with redness of the left eye as well as dryness of the left eye.  She denies any upper respiratory infection, sore throat, nausea or vomiting.  No fever or chills.  No sick contacts.  No similar symptoms in family members or close acquaintances.  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) 04/29/2018   History of pulmonary embolism 04/29/2018   BMI 40.0-44.9, adult ([place]) 04/29/2018   Numbness and tingling of both legs 10/26/2017   Gait difficulty 10/26/2017   Lupus ([place]) 10/26/2017   History of breast cancer 10/26/2017   Diabetes mellitus ([place]) 10/26/2017   Hyperlipidemia 10/26/2017   Blind right eye 10/26/2017   Legally blind in left eye, as defined in [place] 10/26/2017   Long-term use of high-risk medication 10/07/2017   Irritable bowel  syndrome with constipation 07/25/2016   History of Hodgkin's disease 07/25/2016   History of CVA (cerebrovascular accident) 07/25/2016   Muscle spasticity 04/04/2016   Hemiplegia of left nondominant side due to infarction of brain ([place]) 04/04/2016   Essential hypertension 04/04/2016   Vitamin D deficiency 03/08/2009   Allergic rhinitis 09/28/2008   Mild intermittent asthma without complication [id number]   Migraine headache 06/20/2008   Esophageal reflux 01/19/2008    Past Surgical History:  Procedure Laterality Date   ABDOMINAL HYSTERECTOMY     MASTECTOMY     bilat    OB History   No obstetric history on file.      Home Medications    Prior to Admission medications   Medication Sig Start Date End Date Taking? Authorizing Provider  Acetaminophen 500 MG capsule Take by mouth. 08/15/16   [provider]  albuterol (VENTOLIN HFA) 108 (90 Base) MCG/ACT inhaler Inhale into the lungs.    [provider]  amLODipine (NORVASC) 5 MG tablet Take 5 mg by mouth daily. 07/30/20   [provider]  atorvastatin (LIPITOR) 40 MG tablet Take 40 mg by mouth daily.    [provider]  baclofen (LIORESAL) 20 MG tablet Take 20 mg by mouth 3 (three) times daily.    [provider]  carisoprodol (SOMA) 250 MG  tablet Take by mouth. 08/02/20   [provider]  cholecalciferol (VITAMIN D) 1000 units tablet Take by mouth. 07/20/16   [provider]  clobetasol (OLUX) 0.05 % topical foam Apply topically 2 (two) times daily. 12/17/20   [name], MD  DEXILANT 60 MG capsule Take 1 capsule by mouth daily. 06/03/20   [provider]  DULoxetine (CYMBALTA) 60 MG capsule Take 120 mg by mouth daily. 07/02/20   [provider]  esomeprazole (NEXIUM) 40 MG capsule Take 40 mg by mouth daily before breakfast.    [provider]  famotidine (PEPCID) 20 MG tablet Take 20 mg by mouth 2 (two) times daily. 06/03/20   [provider]  folic acid (FOLVITE) Q000111Q MCG tablet Take by mouth. 09/18/16   [provider]  furosemide (LASIX) 20 MG tablet Take 20 mg by mouth daily. 06/03/20   [provider]  hydroxychloroquine (PLAQUENIL) 200 MG tablet Take by mouth. 09/01/16   [provider]  ibuprofen (ADVIL,MOTRIN) 800 MG tablet Take 800 mg by mouth every 8 (eight) hours as needed.    [provider]  ISOtretinoin (ACCUTANE) 30 MG capsule Take 1 capsule (30 mg total) by mouth daily. 12/17/20   [name], MD  lactulose [facility]) 10 GM/15ML solution  07/30/20   [provider]  LINZESS 290 MCG CAPS capsule Take 290 mcg by mouth daily. 07/30/20   [provider]  loratadine (CLARITIN) 10 MG tablet Take 10 mg by mouth daily.    [provider]  losartan (COZAAR) 25 MG tablet Take 25 mg by mouth 2 (two) times daily.    [provider]  methotrexate 50 MG/2ML injection Inject into the vein.    [provider]  metoprolol succinate (TOPROL-XL) 100 MG 24 hr tablet Take 100 mg by mouth daily. Take with or immediately following a meal.    [provider]  Multiple Vitamin (MULTIVITAMIN) capsule Take by mouth. 07/27/16   [provider]  omeprazole (PRILOSEC) 20 MG capsule Take 20 mg by mouth daily.    [provider]  oxyCODONE-acetaminophen (PERCOCET/ROXICET) 5-325 MG tablet Take by mouth. 08/02/20   [provider]  polyethylene glycol powder (GLYCOLAX/MIRALAX) powder MIX 17 GRAMS IN LIQUID AND DRINK DAILY AS NEEDED 10/06/16   [provider]  potassium chloride (KLOR-CON) 10 MEQ tablet Take 10 mEq by mouth daily. 06/03/20   [provider]  SYMBICORT 80-4.5 MCG/ACT inhaler Inhale into the lungs. 07/30/20   [provider]  tiZANidine (ZANAFLEX) 4 MG tablet Take by mouth. 05/08/20   [provider]  topiramate (TOPAMAX) 25 MG tablet Take 25 mg by mouth daily. 08/02/20   [provider]  traZODone (DESYREL) 150 MG tablet Take by mouth at bedtime.    [provider]  umeclidinium-vilanterol (ANORO ELLIPTA) 62.5-25 MCG/INH AEPB Inhale 1 puff into the lungs daily.    [provider]  valACYclovir (VALTREX) 1000 MG tablet Take 1,000 mg by mouth daily. 07/30/20   [provider]  valsartan-hydrochlorothiazide (DIOVAN-HCT) 320-12.5 MG tablet TAKE 1 TABLET EVERY DAY 12/25/16   [provider]  vitamin B-12 (CYANOCOBALAMIN) 1000 MCG tablet Take by mouth. 09/18/16   [provider]  Vitamin E 400 units TABS Take by mouth. 08/15/16   [provider]    Family History History reviewed. No pertinent family history.  Social History Social History   Tobacco Use   Smoking status: Former    Types: Cigarettes  Quit date: 10/27/2014    Years since quitting: 6.3   Smokeless tobacco: Never  Substance Use Topics   Alcohol use: Yes   Drug use: No     Allergies   Erythromycin, Penicillins, Sulfa antibiotics, Morphine, Tetracyclines & related, and Morphine and related   Review of Systems Review of Systems  Constitutional: Negative.   HENT: Negative.    Eyes:  Positive for photophobia and redness. Negative for pain and itching.  Respiratory: Negative.      Physical Exam Triage Vital Signs ED Triage Vitals  Enc Vitals Group     BP 02/15/21 1126 109/71     Pulse Rate 02/15/21 1126 72     Resp 02/15/21 1126 16     Temp 02/15/21 1126 98.2 F (36.8 C)     Temp Source 02/15/21 1126 Oral     SpO2 02/15/21 1126 95 %     Weight --      Height --      Head Circumference --      Peak Flow --      Pain Score 02/15/21 1127 8     Pain Loc --      Pain Edu? --      Excl. in [place]? --    No data found.  Updated Vital Signs BP 109/71 (BP Location: Right Arm)   Pulse 72   Temp 98.2 F (36.8 C) (Oral)   Resp 16   SpO2 95%   Visual Acuity Right Eye Distance:   Left Eye Distance:   Bilateral Distance:    Right  Eye Near:   Left Eye Near:    Bilateral Near:     Physical Exam Vitals and nursing note reviewed.  Constitutional:      General: She is not in acute distress.    Appearance: She is not ill-appearing.  HENT:     Right Ear: Tympanic membrane normal.     Left Ear: Tympanic membrane normal.     Nose: No congestion or rhinorrhea.  Eyes:     Pupils: Pupils are equal, round, and reactive to light.     Comments: Right eye is prosthetic. Left eye conjunctiva is mildly erythematous.  Pupils are reactive to direct and indirect light.  Extraocular movement is intact bilaterally.  Neurological:     Mental Status: She is alert.     UC Treatments / Results  Labs (all labs ordered are listed, but only abnormal results are displayed) Labs Reviewed - No data to display  EKG   Radiology No results found.  Procedures Procedures (including critical care time)  Medications Ordered in UC Medications - No data to display  Initial Impression / Assessment and Plan / UC Course  I have reviewed the triage vital signs and the nursing notes.  Pertinent labs & imaging results that were available during my care of the patient were reviewed by me and considered in my medical decision making (see chart for details).     1.  Blurry vision in the left eye: I spoke with the ophthalmologist on call and they recommended the patient come to his office to be evaluated.  Patient was given the address of the office and the importance of following up today to get seen.  The worry is that the patient could have retinal detachment.  Patient was accompanied by family member who agrees to drive patient to the ophthalmologist office.  Discharge papers were given to the patient. Final Clinical Impressions(s) / UC Diagnoses  Final diagnoses:  Blurry vision, left eye     Discharge Instructions      Please go to the ophthalmologist office immediately.  The ophthalmologist will see you at 1:30 PM today.  Make  sure you are there before that time so you get seen.   ED Prescriptions   None    PDMP not reviewed this encounter.   Chase Picket, MD 02/15/21 1415

## 2021-02-15 NOTE — ED Triage Notes (Signed)
Left eye redness since yesterday states vision become blurry yesterday afternoon.  States eye feel gritty, dry and achy.    Patient states she is completely blind in right eye since 2009

## 2021-02-15 NOTE — Discharge Instructions (Addendum)
Please go to the ophthalmologist office immediately.  The ophthalmologist will see you at 1:30 PM today.  Make sure you are there before that time so you get seen.

## 2021-02-20 ENCOUNTER — Ambulatory Visit: Payer: Medicare Other | Admitting: Physician Assistant

## 2021-02-24 ENCOUNTER — Ambulatory Visit (INDEPENDENT_AMBULATORY_CARE_PROVIDER_SITE_OTHER): Payer: BC Managed Care – PPO | Admitting: Dermatology

## 2021-02-24 ENCOUNTER — Other Ambulatory Visit: Payer: Self-pay

## 2021-02-24 DIAGNOSIS — L669 Cicatricial alopecia, unspecified: Secondary | ICD-10-CM | POA: Diagnosis not present

## 2021-02-24 DIAGNOSIS — L700 Acne vulgaris: Secondary | ICD-10-CM

## 2021-02-24 MED ORDER — ISOTRETINOIN 30 MG PO CAPS: 30.0000 mg | ORAL_CAPSULE | Freq: Every day | ORAL | 0 refills | Status: DC

## 2021-02-24 MED ORDER — CLOBETASOL PROPIONATE 0.05 % EX FOAM: Freq: Two times a day (BID) | CUTANEOUS | 3 refills | Status: DC

## 2021-02-25 LAB — LIPID PANEL
Cholesterol: 131 mg/dL (ref <200)
HDL: 44 mg/dL — ABNORMAL LOW (ref >=50)
LDL Cholesterol (Calc): 67 mg/dL (calc)
Non-HDL Cholesterol (Calc): 87 mg/dL (calc) (ref <130)
Total CHOL/HDL Ratio: 3 (calc) (ref <5.0)
Triglycerides: 113 mg/dL (ref <150)

## 2021-02-25 LAB — COMPREHENSIVE METABOLIC PANEL
AG Ratio: 1 (calc) (ref 1.0–2.5)
ALT: 15 U/L (ref 6–29)
AST: 20 U/L (ref 10–35)
Albumin: 3.6 g/dL (ref 3.6–5.1)
Alkaline phosphatase (APISO): 94 U/L (ref 37–153)
BUN/Creatinine Ratio: 17 (calc) (ref 6–22)
BUN: 18 mg/dL (ref 7–25)
CO2: 27 mmol/L (ref 20–32)
Calcium: 9 mg/dL (ref 8.6–10.4)
Chloride: 107 mmol/L (ref 98–110)
Creat: 1.05 mg/dL — ABNORMAL HIGH (ref 0.50–1.03)
Globulin: 3.7 g/dL (calc) (ref 1.9–3.7)
Glucose, Bld: 74 mg/dL (ref 65–99)
Potassium: 4.1 mmol/L (ref 3.5–5.3)
Sodium: 140 mmol/L (ref 135–146)
Total Bilirubin: 0.4 mg/dL (ref 0.2–1.2)
Total Protein: 7.3 g/dL (ref 6.1–8.1)

## 2021-02-25 LAB — CBC WITH DIFFERENTIAL/PLATELET
Absolute Monocytes: 673 cells/uL (ref 200–950)
Basophils Absolute: 61 cells/uL (ref 0–200)
Basophils Relative: 0.6 %
Eosinophils Absolute: 112 cells/uL (ref 15–500)
Eosinophils Relative: 1.1 %
HCT: 42.6 % (ref 35.0–45.0)
Hemoglobin: 13.6 g/dL (ref 11.7–15.5)
Lymphs Abs: 2530 cells/uL (ref 850–3900)
MCH: 27.8 pg (ref 27.0–33.0)
MCHC: 31.9 g/dL — ABNORMAL LOW (ref 32.0–36.0)
MCV: 86.9 fL (ref 80.0–100.0)
MPV: 10.9 fL (ref 7.5–12.5)
Monocytes Relative: 6.6 %
Neutro Abs: 6824 cells/uL (ref 1500–7800)
Neutrophils Relative %: 66.9 %
Platelets: 280 10*3/uL (ref 140–400)
RBC: 4.9 10*6/uL (ref 3.80–5.10)
RDW: 14.1 % (ref 11.0–15.0)
Total Lymphocyte: 24.8 %
WBC: 10.2 10*3/uL (ref 3.8–10.8)

## 2021-02-25 LAB — PREGNANCY, URINE: Preg Test, Ur: NEGATIVE

## 2021-02-28 ENCOUNTER — Encounter: Payer: Self-pay | Admitting: Emergency Medicine

## 2021-02-28 ENCOUNTER — Ambulatory Visit
Admission: EM | Admit: 2021-02-28 | Discharge: 2021-02-28 | Disposition: A | Discharge status: Home / Self Care | Payer: Medicare Other | Attending: Emergency Medicine | Admitting: Emergency Medicine

## 2021-02-28 DIAGNOSIS — M5441 Lumbago with sciatica, right side: Secondary | ICD-10-CM | POA: Diagnosis not present

## 2021-02-28 MED ORDER — CYCLOBENZAPRINE HCL 10 MG PO TABS: 10.0000 mg | ORAL_TABLET | Freq: Every day | ORAL | 0 refills | Status: DC

## 2021-02-28 MED ORDER — TIZANIDINE HCL 2 MG PO CAPS: 2.0000 mg | ORAL_CAPSULE | Freq: Three times a day (TID) | ORAL | 0 refills | Status: AC

## 2021-02-28 MED ORDER — DEXAMETHASONE SODIUM PHOSPHATE 10 MG/ML IJ SOLN
10.0000 mg | Freq: Once | INTRAMUSCULAR | Status: AC
  Administered 2021-02-28: 10 mg via INTRAMUSCULAR

## 2021-02-28 NOTE — ED Triage Notes (Signed)
Pt presents today with c/o of right groin and hip pain x 2-3 weeks. She also reports falling down two steps today.

## 2021-02-28 NOTE — ED Provider Notes (Addendum)
[place] [facility]   [id number] 02/28/21 Arrival Time: [id number]  CC: Back PAIN  SUBJECTIVE: History from: patient. Mary Wilson is a 55 y.o. female complains of RT low back pain that radiates into RT hip, groin and leg that has been going on for 2-3 weeks.  Denies a precipitating event or specific injury.  Localizes the pain to the RT low back.  Describes the pain as constant and dull in character.  Has tried OTC medications without relief.  Symptoms are made worse with bearing weight.  Denies similar symptoms in the past.  Hx significant for TIA and has partial paralysis on the LT side.  Denies fever, chills, erythema, ecchymosis, effusion, saddle paresthesias, loss of bowel or bladder function.      ROS: As per HPI.  All other pertinent ROS negative.     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 ([facility])    Hodgkin disease ([place])    Hodgkin's disease ([place])    HTN (hypertension)    Hyperlipidemia    Intractable migraine    Legally blind    Lupus erythematosus    Muscle spasticity    Stroke ([place])    Vision abnormalities    Vitamin D deficiency    Past Surgical History:  Procedure Laterality Date   ABDOMINAL HYSTERECTOMY     MASTECTOMY     bilat   Allergies  Allergen Reactions   Erythromycin Nausea And Vomiting and Shortness Of Breath   Penicillins Anaphylaxis   Sulfa Antibiotics Anaphylaxis and Nausea And Vomiting   Morphine Other (See Comments)    insomnia   Tetracyclines & Related Nausea And Vomiting   Morphine And Related Other (See Comments)    Stop breathing   No current facility-administered medications on file prior to encounter.   Current Outpatient Medications on File Prior to Encounter  Medication Sig Dispense Refill    Acetaminophen 500 MG capsule Take by mouth.     albuterol (VENTOLIN HFA) 108 (90 Base) MCG/ACT inhaler Inhale into the lungs.     amLODipine (NORVASC) 5 MG tablet Take 5 mg by mouth daily.     atorvastatin (LIPITOR) 40 MG tablet Take 40 mg by mouth daily.     cholecalciferol (VITAMIN D) 1000 units tablet Take by mouth.     clobetasol (OLUX) 0.05 % topical foam Apply topically 2 (two) times daily. 50 g 3   DEXILANT 60 MG capsule Take 1 capsule by mouth daily.     DULoxetine (CYMBALTA) 60 MG capsule Take 120 mg by mouth daily.     esomeprazole (NEXIUM) 40 MG capsule Take 40 mg by mouth daily before breakfast.     famotidine (PEPCID) 20 MG tablet Take 20 mg by mouth 2 (two) times daily.     folic acid (FOLVITE) Q000111Q MCG tablet Take by mouth.     furosemide (LASIX) 20 MG tablet Take 20 mg by mouth daily.     hydroxychloroquine (PLAQUENIL) 200 MG tablet Take by mouth.     ibuprofen (ADVIL,MOTRIN) 800 MG tablet Take 800 mg by mouth every 8 (eight) hours as needed.     ISOtretinoin (ACCUTANE) 30 MG capsule Take 1 capsule (30 mg total) by mouth daily. 30 capsule 0   lactulose (CHRONULAC)  10 GM/15ML solution      LINZESS 290 MCG CAPS capsule Take 290 mcg by mouth daily.     loratadine (CLARITIN) 10 MG tablet Take 10 mg by mouth daily.     losartan (COZAAR) 25 MG tablet Take 25 mg by mouth 2 (two) times daily.     methotrexate 50 MG/2ML injection Inject into the vein.     metoprolol succinate (TOPROL-XL) 100 MG 24 hr tablet Take 100 mg by mouth daily. Take with or immediately following a meal.     Multiple Vitamin (MULTIVITAMIN) capsule Take by mouth.     omeprazole (PRILOSEC) 20 MG capsule Take 20 mg by mouth daily.     oxyCODONE-acetaminophen (PERCOCET/ROXICET) 5-325 MG tablet Take by mouth.     polyethylene glycol powder (GLYCOLAX/MIRALAX) powder MIX 17 GRAMS IN LIQUID AND DRINK DAILY AS NEEDED     potassium chloride (KLOR-CON) 10 MEQ tablet Take 10 mEq by mouth daily.     SYMBICORT 80-4.5 MCG/ACT  inhaler Inhale into the lungs.     topiramate (TOPAMAX) 25 MG tablet Take 25 mg by mouth daily.     traZODone (DESYREL) 150 MG tablet Take by mouth at bedtime.     umeclidinium-vilanterol (ANORO ELLIPTA) 62.5-25 MCG/INH AEPB Inhale 1 puff into the lungs daily.     valACYclovir (VALTREX) 1000 MG tablet Take 1,000 mg by mouth daily.     valsartan-hydrochlorothiazide (DIOVAN-HCT) 320-12.5 MG tablet TAKE 1 TABLET EVERY DAY     vitamin B-12 (CYANOCOBALAMIN) 1000 MCG tablet Take by mouth.     Vitamin E 400 units TABS Take by mouth.     Social History   Socioeconomic History   Marital status: Married    Spouse name: Not on file   Number of children: 2   Years of education: Not on file   Highest education level: Not on file  Occupational History   Not on file  Tobacco Use   Smoking status: Former    Types: Cigarettes    Quit date: 10/27/2014    Years since quitting: 6.3   Smokeless tobacco: Never  Substance and Sexual Activity   Alcohol use: Yes   Drug use: No   Sexual activity: Not on file  Other Topics Concern   Not on file  Social History Narrative   Not on file   Social Determinants of Health   Financial Resource Strain: Not on file  Food Insecurity: Not on file  Transportation Needs: Not on file  Physical Activity: Not on file  Stress: Not on file  Social Connections: Not on file  Intimate Partner Violence: Not on file   History reviewed. No pertinent family history.  OBJECTIVE:  Vitals:   02/28/21 1537  BP: 118/76  Pulse: 75  Resp: 18  Temp: 98.5 F (36.9 C)  TempSrc: Oral  SpO2: 98%    General appearance: ALERT; in no acute distress.  Head: NCAT Lungs: Normal respiratory effort Musculoskeletal: back Inspection: Skin warm, dry, clear and intact without obvious erythema, effusion, or ecchymosis.  Palpation: TTP over RT buttock ROM: FROM active and passive Strength: 4+/5 hip flexion, 4+/5 hip extension Skin: warm and dry Neurologic: Ambulates with  difficulty, with cane; Sensation intact about the upper/ lower extremities Psychological: alert and cooperative; normal mood and affect   ASSESSMENT & PLAN:  1. Acute right-sided low back pain with right-sided sciatica     Meds ordered this encounter  Medications   cyclobenzaprine (FLEXERIL) 10 MG tablet    Sig: Take 1  tablet (10 mg total) by mouth at bedtime.    Dispense:  10 tablet    Refill:  0    Order Specific Question:   Supervising Provider    Answer:   Raylene Everts [id number]   Unable to rule out groin pressure/ discomfort, issues with bladder sling in urgent care setting.  Offered patient further evaluation and management in the ED.  Patient declines at this time and would like to try outpatient therapy first.  Aware of the risk associated with this decision including missed diagnosis, organ damage, organ failure, and/or death.  Patient aware and in agreement.     Continue conservative management of rest, ice, and gentle stretches Steroid shot given in office Take cyclobenzaprine at nighttime for symptomatic relief. Avoid driving or operating heavy machinery while using medication. Follow up with PCP if symptoms persist Return or go to the ER if you have any new or worsening symptoms (fever, chills, chest pain, abdominal pain, changes in bowel or bladder habits, pain radiating into lower legs, etc...)   Reviewed expectations re: course of current medical issues. Questions answered. Outlined signs and symptoms indicating need for more acute intervention. Patient verbalized understanding. After Visit Summary given.     Lestine Box, PA-C 02/28/21 1605   Pt requests zanaflex after discharge   Lestine Box, [place] 02/28/21 1623

## 2021-02-28 NOTE — Discharge Instructions (Addendum)
Unable to rule out groin pressure/ discomfort, issues with bladder sling in urgent care setting.  Offered patient further evaluation and management in the ED.  Patient declines at this time and would like to try outpatient therapy first.  Aware of the risk associated with this decision including missed diagnosis, organ damage, organ failure, and/or death.  Patient aware and in agreement.     Continue conservative management of rest, ice, and gentle stretches Steroid shot given in office Take cyclobenzaprine at nighttime for symptomatic relief. Avoid driving or operating heavy machinery while using medication.  DO NOT TAKE THIS MEDICATION WITH OTHER MUSCLE RELAXERS Follow up with PCP if symptoms persist Return or go to the ER if you have any new or worsening symptoms (fever, chills, chest pain, abdominal pain, changes in bowel or bladder habits, pain radiating into lower legs, etc...)

## 2021-03-07 ENCOUNTER — Encounter: Payer: Self-pay | Admitting: Dermatology

## 2021-03-07 NOTE — Progress Notes (Signed)
   Follow-Up Visit   Subjective  Mary Wilson is a 55 y.o. female who presents for the following: Acne (Isotretinoin follow up).  Adult cystic acne Location:  Duration:  Quality:  Associated Signs/Symptoms: Modifying Factors:  Severity:  Timing: Context:   Objective  Well appearing patient in no apparent distress; mood and affect are within normal limits. Head - Anterior (Face) Deep inflammatory papules essentially clear, few superficial inflamed lesions.  Moderate PIH.    A focused examination was performed including head and neck.. Relevant physical exam findings are noted in the Assessment and Plan.   Assessment & Plan    Acne vulgaris Head - Anterior (Face)  Continue low-dose isotretinoin (higher dose not well-tolerated).  Call with any questions or problems.  Recheck 1 month.  Related Medications ISOtretinoin (ACCUTANE) 30 MG capsule Take 1 capsule (30 mg total) by mouth daily.  Cicatricial alopecia  Related Medications clobetasol (OLUX) 0.05 % topical foam Apply topically 2 (two) times daily.      I, [name], MD, have reviewed all documentation for this visit.  The documentation on 03/07/21 for the exam, diagnosis, procedures, and orders are all accurate and complete.

## 2021-03-29 LAB — PREGNANCY, URINE: Preg Test, Ur: NEGATIVE

## 2021-03-31 ENCOUNTER — Ambulatory Visit: Payer: Medicare Other | Admitting: Dermatology

## 2021-04-07 ENCOUNTER — Other Ambulatory Visit: Payer: Self-pay

## 2021-04-07 ENCOUNTER — Ambulatory Visit (INDEPENDENT_AMBULATORY_CARE_PROVIDER_SITE_OTHER): Payer: Medicare Other | Admitting: Dermatology

## 2021-04-07 DIAGNOSIS — L700 Acne vulgaris: Secondary | ICD-10-CM | POA: Diagnosis not present

## 2021-04-07 DIAGNOSIS — K130 Diseases of lips: Secondary | ICD-10-CM | POA: Diagnosis not present

## 2021-04-07 MED ORDER — ISOTRETINOIN 30 MG PO CAPS: 30.0000 mg | ORAL_CAPSULE | Freq: Every day | ORAL | 0 refills | Status: DC

## 2021-04-07 NOTE — Patient Instructions (Signed)
Get over the counter Cerave Itch Relief

## 2021-04-21 ENCOUNTER — Encounter: Payer: Self-pay | Admitting: Dermatology

## 2021-04-21 NOTE — Progress Notes (Signed)
   Follow-Up Visit   Subjective  Mary Wilson is a 55 y.o. female who presents for the following: Acne.  Acne on isotretinoin, itching. Location:  Duration:  Quality:  Associated Signs/Symptoms: Modifying Factors:  Severity:  Timing: Context:   Objective  Well appearing patient in no apparent distress; mood and affect are within normal limits. Head - Anterior (Face) We are finally seeing clearing of most deep inflammatory papules but she still gets occasional breakouts.  Moderate postinflammatory hyperpigmentation.  Moderate cheilitis.  Normal mood.  Her face does itch and scratching may add to the hyperpigmentation so she may try CeraVe itch relief as needed itching.    A focused examination was performed including head and neck. Relevant physical exam findings are noted in the Assessment and Plan.   Assessment & Plan    Acne vulgaris Head - Anterior (Face)  Continue isotretinoin.  She knows she can call me if there are any issues.  Related Medications ISOtretinoin (ACCUTANE) 30 MG capsule Take 1 capsule (30 mg total) by mouth daily.      I, [name], MD, have reviewed all documentation for this visit.  The documentation on 04/21/21 for the exam, diagnosis, procedures, and orders are all accurate and complete.

## 2021-05-01 ENCOUNTER — Ambulatory Visit: Payer: Medicare Other | Admitting: Dermatology

## 2021-05-14 ENCOUNTER — Other Ambulatory Visit: Payer: Self-pay

## 2021-05-14 ENCOUNTER — Ambulatory Visit (INDEPENDENT_AMBULATORY_CARE_PROVIDER_SITE_OTHER): Payer: Medicare Other | Admitting: Dermatology

## 2021-05-14 ENCOUNTER — Encounter: Payer: Self-pay | Admitting: Dermatology

## 2021-05-14 DIAGNOSIS — L700 Acne vulgaris: Secondary | ICD-10-CM | POA: Diagnosis not present

## 2021-05-14 DIAGNOSIS — L603 Nail dystrophy: Secondary | ICD-10-CM

## 2021-05-14 DIAGNOSIS — Q846 Other congenital malformations of nails: Secondary | ICD-10-CM

## 2021-05-14 MED ORDER — ISOTRETINOIN 30 MG PO CAPS: 30.0000 mg | ORAL_CAPSULE | Freq: Every day | ORAL | 0 refills | Status: DC

## 2021-05-28 ENCOUNTER — Other Ambulatory Visit: Payer: Self-pay | Admitting: Orthopedic Surgery

## 2021-05-28 DIAGNOSIS — M25551 Pain in right hip: Secondary | ICD-10-CM

## 2021-06-02 ENCOUNTER — Ambulatory Visit: Payer: Medicare Other | Admitting: Dermatology

## 2021-06-03 ENCOUNTER — Encounter: Payer: Self-pay | Admitting: Dermatology

## 2021-06-03 NOTE — Progress Notes (Signed)
° °  Follow-Up Visit   Subjective  Mary Wilson is a 55 y.o. female who presents for the following: Acne (Isotretinoin f/u).  Follow-up isotretinoin plus check fingernails Location:  Duration:  Quality:  Associated Signs/Symptoms: Modifying Factors:  Severity:  Timing: Context:   Objective  Well appearing patient in no apparent distress; mood and affect are within normal limits. Left Malar Cheek, Right Malar Cheek Continues to improve with tolerance of only low-dose isotretinoin.  She understands that the postinflammatory hyperpigmentation may not resolve.  Normal mood.  Moderate cheilitis.  Right 3rd Finger Nail Plate Koilonychia limited to left ring finger and slight onycholysis of left fourth and fifth fingernails.  With this being an isolated finding I do not feel that evaluation for systemic disorders like iron deficiency anemia is indicated.  I have not seen this note or recall reading of this secondary to isotretinoin.       A focused examination was performed including head, neck, hands, nails. Relevant physical exam findings are noted in the Assessment and Plan.   Assessment & Plan    Acne vulgaris Left Malar Cheek; Right Malar Cheek  Continue isotretinoin.  Call with any issues.  Related Medications ISOtretinoin (ACCUTANE) 30 MG capsule Take 1 capsule (30 mg total) by mouth daily.  Onychodystrophy Right 3rd Finger Nail Plate  Observation without intervention.      I, [name], MD, have reviewed all documentation for this visit.  The documentation on 06/03/21 for the exam, diagnosis, procedures, and orders are all accurate and complete.

## 2021-06-19 ENCOUNTER — Other Ambulatory Visit: Payer: Self-pay

## 2021-06-19 ENCOUNTER — Encounter: Payer: Self-pay | Admitting: Physician Assistant

## 2021-06-19 ENCOUNTER — Ambulatory Visit (INDEPENDENT_AMBULATORY_CARE_PROVIDER_SITE_OTHER): Payer: Commercial Managed Care - HMO | Admitting: Physician Assistant

## 2021-06-19 DIAGNOSIS — L739 Follicular disorder, unspecified: Secondary | ICD-10-CM | POA: Diagnosis not present

## 2021-06-19 DIAGNOSIS — L658 Other specified nonscarring hair loss: Secondary | ICD-10-CM

## 2021-06-19 DIAGNOSIS — L659 Nonscarring hair loss, unspecified: Secondary | ICD-10-CM

## 2021-06-19 DIAGNOSIS — L700 Acne vulgaris: Secondary | ICD-10-CM

## 2021-06-19 MED ORDER — ALCLOMETASONE DIPROPIONATE 0.05 % EX CREA: TOPICAL_CREAM | Freq: Two times a day (BID) | CUTANEOUS | 3 refills | Status: DC | PRN

## 2021-06-19 MED ORDER — FLUOCINOLONE ACETONIDE BODY 0.01 % EX OIL: TOPICAL_OIL | CUTANEOUS | 8 refills | Status: AC

## 2021-06-19 MED ORDER — MUPIROCIN 2 % EX OINT: 1.0000 "application " | TOPICAL_OINTMENT | Freq: Two times a day (BID) | CUTANEOUS | 0 refills | Status: DC

## 2021-06-19 MED ORDER — ISOTRETINOIN 30 MG PO CAPS: 30.0000 mg | ORAL_CAPSULE | Freq: Every day | ORAL | 0 refills | Status: DC

## 2021-06-23 ENCOUNTER — Encounter: Payer: Self-pay | Admitting: Physician Assistant

## 2021-06-23 NOTE — Progress Notes (Signed)
° °  Follow-Up Visit   Subjective  Mary Wilson is a 56 y.o. female who presents for the following: Acne (31 days f/u isotretinoin). No problems with side effects but she has been picking the left side of her chin and it is irritated. Her hair is also falling out since taking the medication. She had hair extensions that remain in the posterior aspect only.    The following portions of the chart were reviewed this encounter and updated as appropriate:  Tobacco   Allergies   Meds   Problems   Med Hx   Surg Hx   Fam Hx       Objective  Well appearing patient in no apparent distress; mood and affect are within normal limits.  A focused examination was performed including face. Relevant physical exam findings are noted in the Assessment and Plan.  Left Zygomatic Area, Right Malar Cheek Active papules and hyperpigmentation.   frontal scalp Anterior scalp broken off.   Left Chin Perifollicular erythema with excoriations and open wounds.    Assessment & Plan  Acne vulgaris Left Zygomatic Area; Right Malar Cheek  ISOtretinoin (ACCUTANE) 30 MG capsule - Left Zygomatic Area, Right Malar Cheek Take 1 capsule (30 mg total) by mouth daily.  Traction Alopecia frontal scalp  alclomethasone (ACLOVATE) 0.05 % cream - frontal scalp Apply topically 2 (two) times daily as needed (Rash).  Fluocinolone Acetonide Body (DERMA-SMOOTHE/FS BODY) 0.01 % OIL - frontal scalp Apply to affected area qd  Folliculitis Left Chin  mupirocin ointment (BACTROBAN) 2 % - Left Chin Apply 1 application topically 2 (two) times daily.    I, Rasa Degrazia, PA-C, have reviewed all documentation's for this visit.  The documentation on 06/23/21 for the exam, diagnosis, procedures and orders are all accurate and complete.

## 2021-07-03 ENCOUNTER — Ambulatory Visit: Payer: Medicare Other | Admitting: Dermatology

## 2021-07-04 ENCOUNTER — Ambulatory Visit
Admission: RE | Admit: 2021-07-04 | Discharge: 2021-07-04 | Disposition: A | Discharge status: Home / Self Care | Payer: Medicare Other | Source: Ambulatory Visit | Attending: Orthopedic Surgery | Admitting: Orthopedic Surgery

## 2021-07-04 DIAGNOSIS — M25551 Pain in right hip: Secondary | ICD-10-CM

## 2021-07-07 ENCOUNTER — Other Ambulatory Visit: Payer: Self-pay | Admitting: Orthopedic Surgery

## 2021-07-07 DIAGNOSIS — G8929 Other chronic pain: Secondary | ICD-10-CM

## 2021-07-07 DIAGNOSIS — M545 Low back pain, unspecified: Secondary | ICD-10-CM

## 2021-07-17 ENCOUNTER — Ambulatory Visit: Payer: Medicare Other

## 2021-07-21 ENCOUNTER — Ambulatory Visit: Payer: Medicare Other | Admitting: Dermatology

## 2021-08-04 ENCOUNTER — Ambulatory Visit: Payer: Medicare Other | Admitting: Dermatology

## 2021-08-25 ENCOUNTER — Ambulatory Visit: Payer: Medicare Other | Admitting: Dermatology

## 2021-09-25 ENCOUNTER — Ambulatory Visit (INDEPENDENT_AMBULATORY_CARE_PROVIDER_SITE_OTHER): Payer: BC Managed Care – PPO | Admitting: Dermatology

## 2021-09-25 ENCOUNTER — Other Ambulatory Visit: Payer: Self-pay | Admitting: Dermatology

## 2021-09-25 DIAGNOSIS — L700 Acne vulgaris: Secondary | ICD-10-CM | POA: Diagnosis not present

## 2021-09-25 MED ORDER — ISOTRETINOIN 30 MG PO CAPS: 30.0000 mg | ORAL_CAPSULE | Freq: Every day | ORAL | 0 refills | Status: DC

## 2021-09-25 MED ORDER — AZELAIC ACID 20 % EX CREA: TOPICAL_CREAM | CUTANEOUS | 6 refills | Status: DC

## 2021-10-13 ENCOUNTER — Encounter: Payer: Self-pay | Admitting: Dermatology

## 2021-10-13 NOTE — Progress Notes (Signed)
? ?  Follow-Up Visit ?  ?Subjective  ?Mary Wilson is a 56 y.o. female who presents for the following: Acne (Isotretinoin follow up.). ? ?Cystic acne, treated with estrogen but I pledge compliance has been challenging. ?Location:  ?Duration:  ?Quality:  ?Associated Signs/Symptoms: ?Modifying Factors:  ?Severity:  ?Timing: ?Context:  ? ?Objective  ?Well appearing patient in no apparent distress; mood and affect are within normal limits. ?Head - Anterior (Face) ?Skin clear of deep cysts, few superficial lesions, moderate postinflammatory hyperpigmentation. ? ? ? ?A focused examination was performed including head and neck. Relevant physical exam findings are noted in the Assessment and Plan. ? ? ?Assessment & Plan  ? ? ?Acne vulgaris ?Head - Anterior (Face) ? ?Discontinue isotretinoin.  Can use topical dapsone daily.  Contact me if there is a severe flare. ? ?azelaic acid (AZELEX) 20 % cream - Head - Anterior (Face) ?Apply bid ? ? ? ? ? ?I, [name], MD, have reviewed all documentation for this visit.  The documentation on 10/13/21 for the exam, diagnosis, procedures, and orders are all accurate and complete. ?

## 2021-12-22 ENCOUNTER — Other Ambulatory Visit: Payer: Self-pay | Admitting: Physician Assistant

## 2021-12-22 DIAGNOSIS — L659 Nonscarring hair loss, unspecified: Secondary | ICD-10-CM

## 2022-01-26 IMAGING — CT CT HIP*R* W/O CM
1 series · 16 of 32 positions shown, 20 images · non-contrast
Comparison: None.

CLINICAL DATA: Low back and right groin pain. Remote history of
breast cancer.

EXAM:
CT OF THE RIGHT HIP WITHOUT CONTRAST
TECHNIQUE: Multidetector CT imaging of the right hip was performed according to
the standard protocol. Multiplanar CT image reconstructions were
also generated.
RADIATION DOSE REDUCTION: This exam was performed according to the
departmental dose-optimization program which includes automated
exposure control, adjustment of the mA and/or kV according to
patient size and/or use of iterative reconstruction technique.

[Series 4: hip 2.0 st · axial · 0.43mm/px · z∈[-318,-90]mm · 16 of 128 slices shown, 20 images]
[im 9/128  soft-tissue]
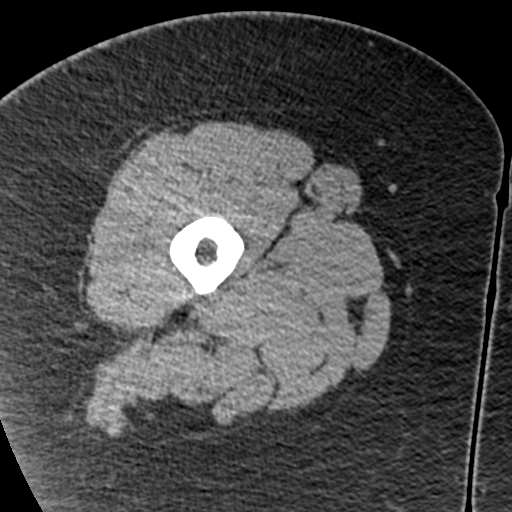
[im 9/128  bone]
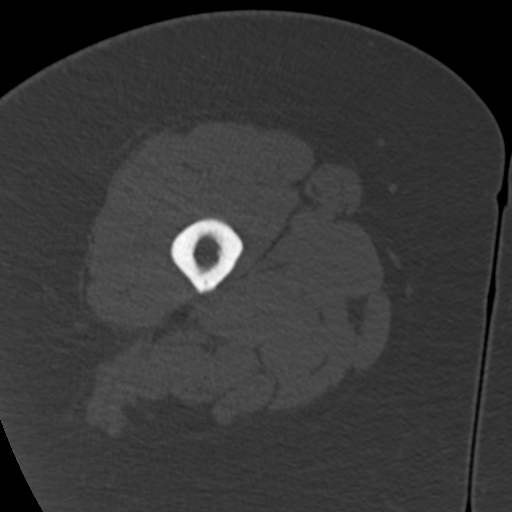
[im 17/128  soft-tissue]
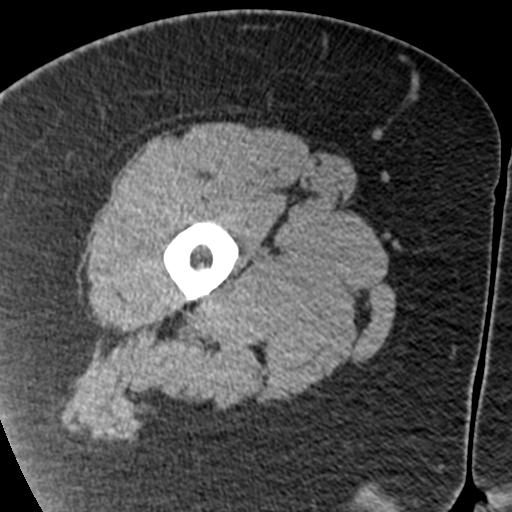
[im 25/128  soft-tissue]
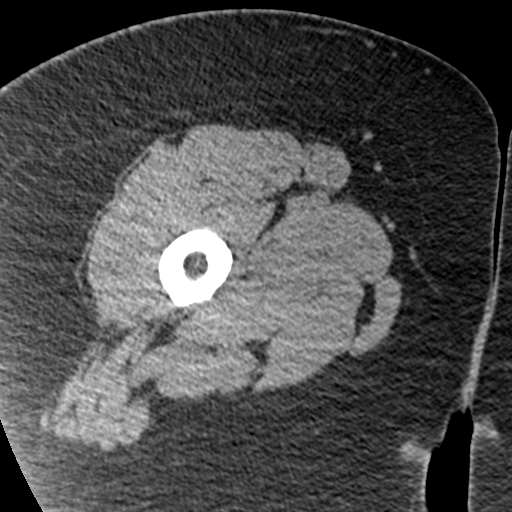
[im 33/128  soft-tissue]
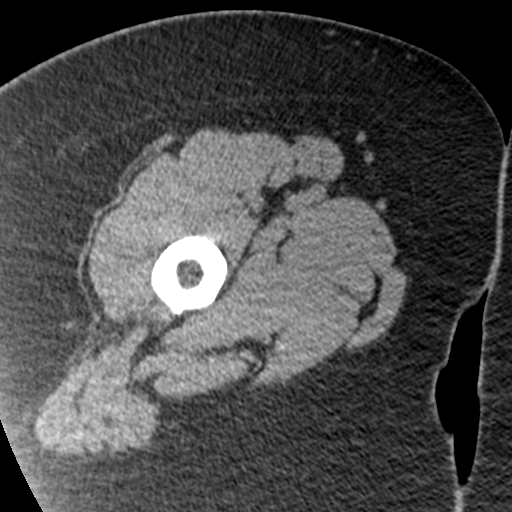
[im 41/128  soft-tissue]
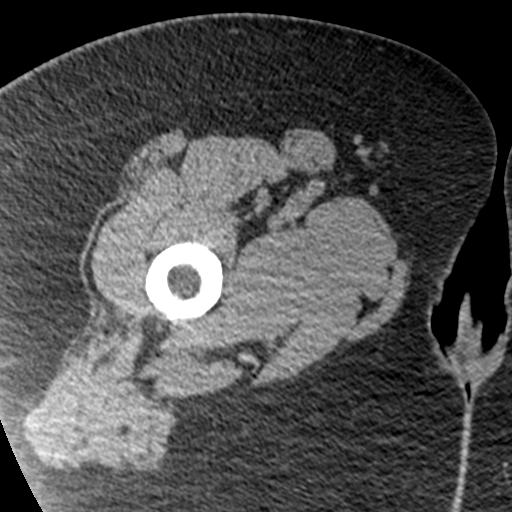
[im 50/128  soft-tissue]
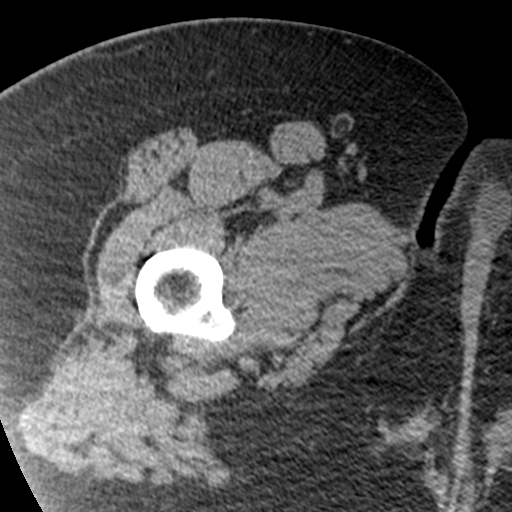
[im 58/128  soft-tissue]
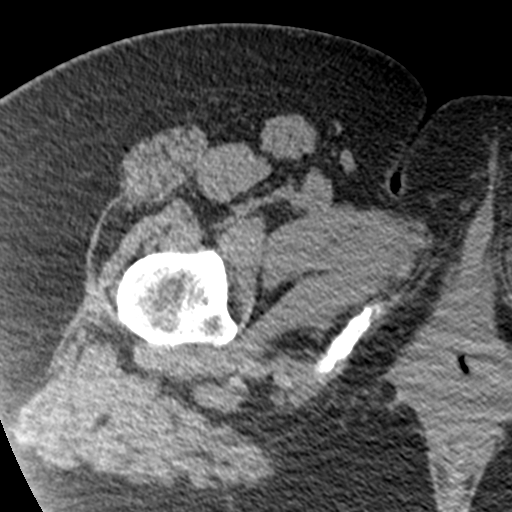
[im 70/128  soft-tissue]
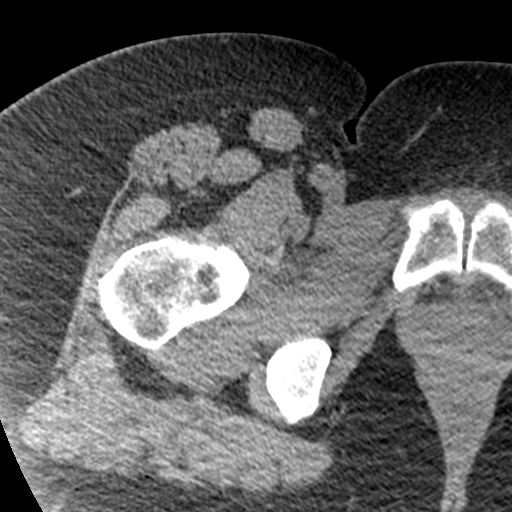
[im 78/128  soft-tissue]
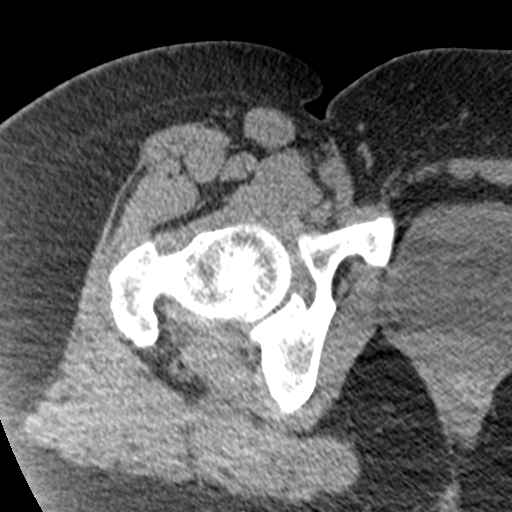
[im 78/128  bone]
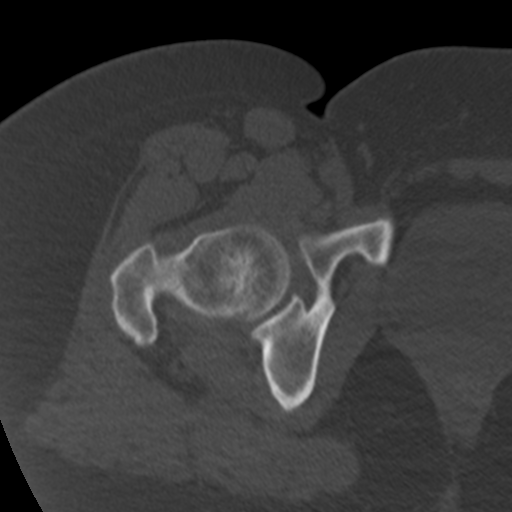
[im 87/128  soft-tissue]
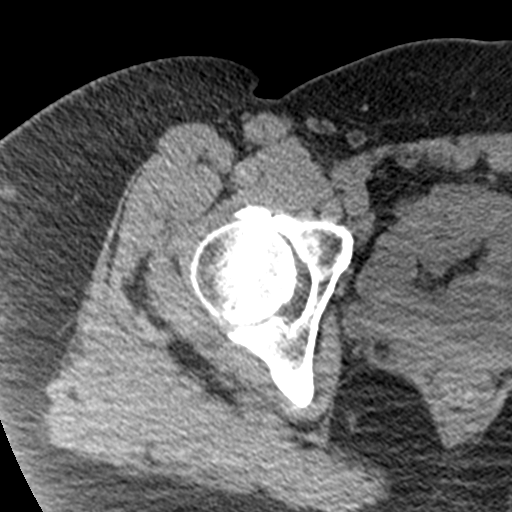
[im 95/128  soft-tissue]
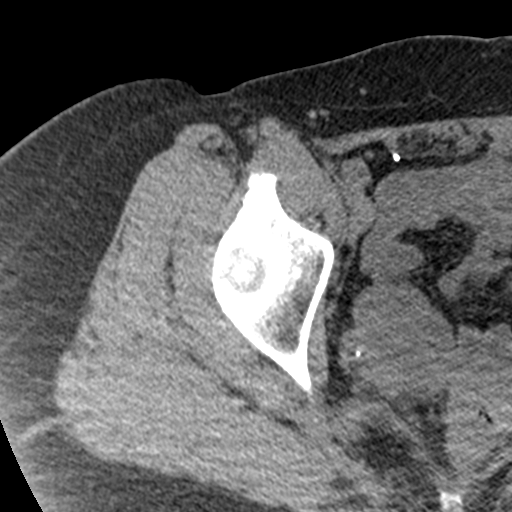
[im 103/128  soft-tissue]
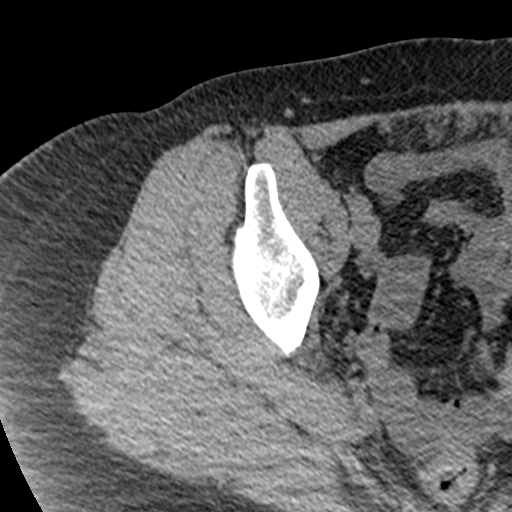
[im 111/128  soft-tissue]
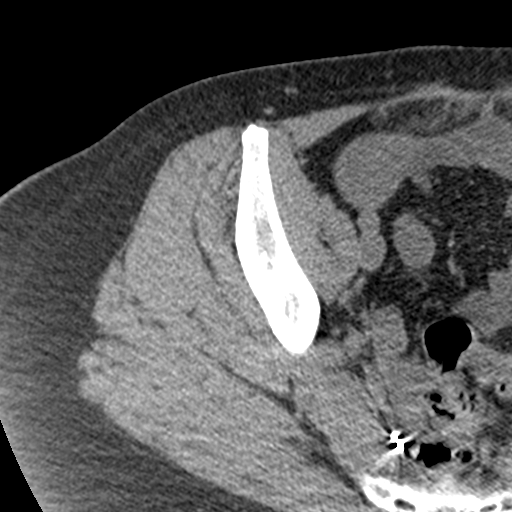
[im 111/128  lung]
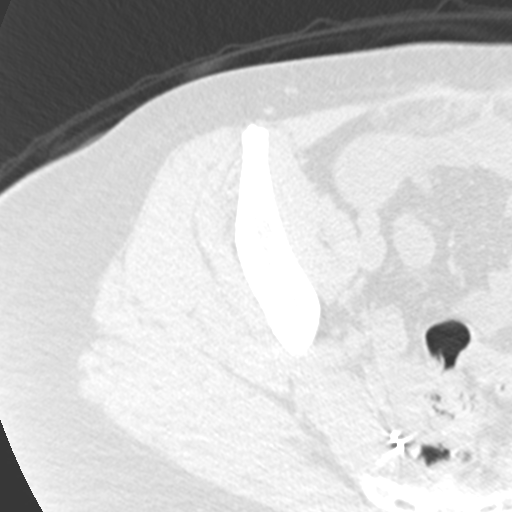
[im 115/128  lung]
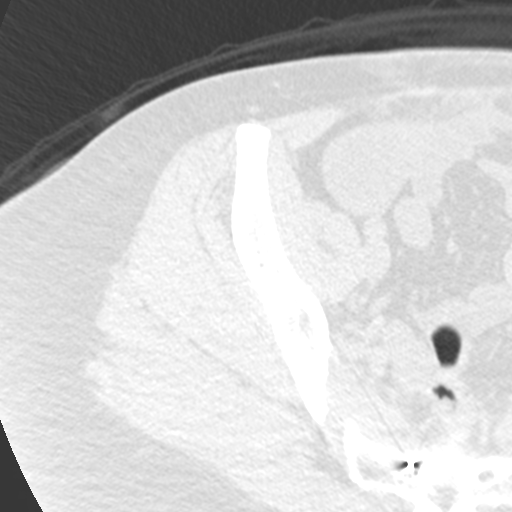
[im 119/128  soft-tissue]
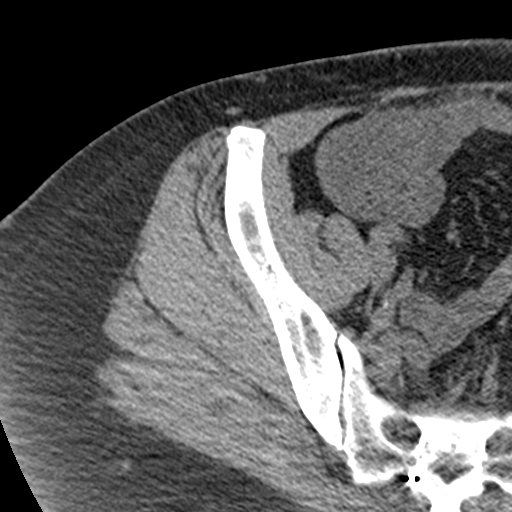
[im 119/128  lung]
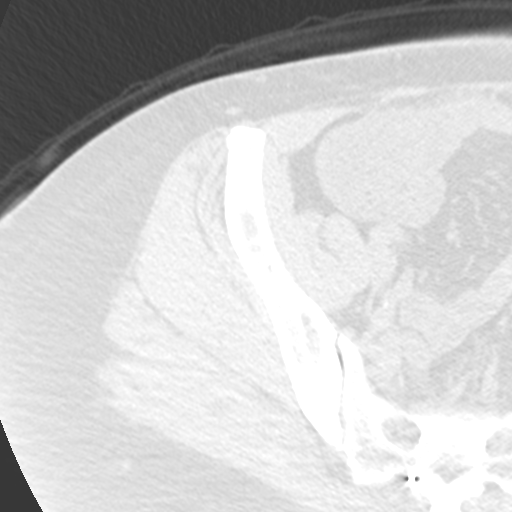
[im 123/128  lung]
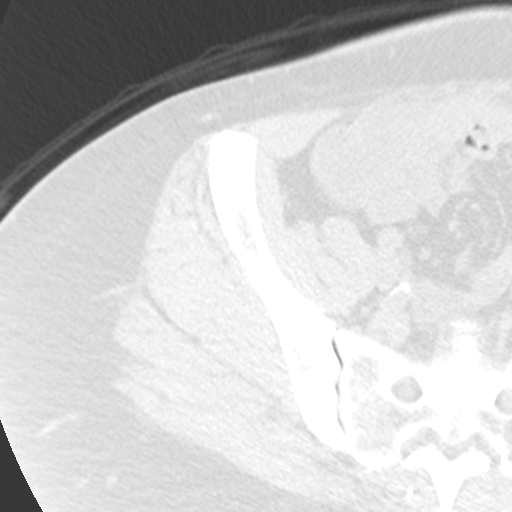

[16 of 32 positions shown; findings below may reference images not displayed]

FINDINGS: Bones/Joint/Cartilage

No fracture or dislocation. Mild right hip joint space narrowing
with small marginal osteophytes. No joint effusion.

Ligaments

Ligaments are suboptimally evaluated by CT.

Muscles and Tendons
Grossly intact.  No muscle atrophy.

Soft tissue
No fluid collection or hematoma. No soft tissue mass. Sacral nerve
stimulator noted.
IMPRESSION: 1. Mild right hip osteoarthritis.

## 2022-10-03 ENCOUNTER — Emergency Department (HOSPITAL_COMMUNITY)
Admission: EM | Admit: 2022-10-03 | Discharge: 2022-10-03 | Disposition: A | Discharge status: Home / Self Care | Payer: BC Managed Care – PPO | Attending: Emergency Medicine | Admitting: Emergency Medicine

## 2022-10-03 ENCOUNTER — Encounter (HOSPITAL_COMMUNITY): Payer: Self-pay

## 2022-10-03 ENCOUNTER — Other Ambulatory Visit: Payer: Self-pay

## 2022-10-03 ENCOUNTER — Emergency Department (HOSPITAL_COMMUNITY): Payer: BC Managed Care – PPO

## 2022-10-03 DIAGNOSIS — Z87891 Personal history of nicotine dependence: Secondary | ICD-10-CM | POA: Diagnosis not present

## 2022-10-03 DIAGNOSIS — Z853 Personal history of malignant neoplasm of breast: Secondary | ICD-10-CM | POA: Diagnosis not present

## 2022-10-03 DIAGNOSIS — K3184 Gastroparesis: Secondary | ICD-10-CM | POA: Diagnosis not present

## 2022-10-03 DIAGNOSIS — Z8673 Personal history of transient ischemic attack (TIA), and cerebral infarction without residual deficits: Secondary | ICD-10-CM | POA: Insufficient documentation

## 2022-10-03 DIAGNOSIS — J45909 Unspecified asthma, uncomplicated: Secondary | ICD-10-CM | POA: Insufficient documentation

## 2022-10-03 DIAGNOSIS — R112 Nausea with vomiting, unspecified: Secondary | ICD-10-CM | POA: Diagnosis present

## 2022-10-03 DIAGNOSIS — I10 Essential (primary) hypertension: Secondary | ICD-10-CM | POA: Insufficient documentation

## 2022-10-03 DIAGNOSIS — E119 Type 2 diabetes mellitus without complications: Secondary | ICD-10-CM | POA: Diagnosis not present

## 2022-10-03 DIAGNOSIS — Z79899 Other long term (current) drug therapy: Secondary | ICD-10-CM | POA: Diagnosis not present

## 2022-10-03 LAB — CBC
HCT: 39.1 % (ref 36.0–46.0)
Hemoglobin: 13.3 g/dL (ref 12.0–15.0)
MCH: 27.1 pg (ref 26.0–34.0)
MCHC: 34 g/dL (ref 30.0–36.0)
MCV: 79.8 fL — ABNORMAL LOW (ref 80.0–100.0)
Platelets: 295 10*3/uL (ref 150–400)
RBC: 4.9 MIL/uL (ref 3.87–5.11)
RDW: 16 % — ABNORMAL HIGH (ref 11.5–15.5)
WBC: 8.8 10*3/uL (ref 4.0–10.5)
nRBC: 0 % (ref 0.0–0.2)

## 2022-10-03 LAB — URINALYSIS, ROUTINE W REFLEX MICROSCOPIC
Bilirubin Urine: NEGATIVE
Glucose, UA: NEGATIVE mg/dL
Hgb urine dipstick: NEGATIVE
Ketones, ur: 5 mg/dL — AB
Leukocytes,Ua: NEGATIVE
Nitrite: NEGATIVE
Protein, ur: NEGATIVE mg/dL
Specific Gravity, Urine: 1.011 (ref 1.005–1.030)
pH: 5 (ref 5.0–8.0)

## 2022-10-03 LAB — I-STAT CHEM 8, ED
BUN: 10 mg/dL (ref 6–20)
Calcium, Ion: 1.16 mmol/L (ref 1.15–1.40)
Chloride: 98 mmol/L (ref 98–111)
Creatinine, Ser: 1.2 mg/dL — ABNORMAL HIGH (ref 0.44–1.00)
Glucose, Bld: 83 mg/dL (ref 70–99)
HCT: 39 % (ref 36.0–46.0)
Hemoglobin: 13.3 g/dL (ref 12.0–15.0)
Potassium: 4 mmol/L (ref 3.5–5.1)
Sodium: 134 mmol/L — ABNORMAL LOW (ref 135–145)
TCO2: 28 mmol/L (ref 22–32)

## 2022-10-03 LAB — CBG MONITORING, ED: Glucose-Capillary: 105 mg/dL — ABNORMAL HIGH (ref 70–99)

## 2022-10-03 LAB — MAGNESIUM: Magnesium: 1.7 mg/dL (ref 1.7–2.4)

## 2022-10-03 MED ORDER — METOCLOPRAMIDE HCL 5 MG/ML IJ SOLN
10.0000 mg | Freq: Once | INTRAMUSCULAR | Status: AC
  Administered 2022-10-03: 10 mg via INTRAVENOUS
  Filled 2022-10-03: qty 2

## 2022-10-03 MED ORDER — METOCLOPRAMIDE HCL 10 MG PO TABS: 10.0000 mg | ORAL_TABLET | Freq: Three times a day (TID) | ORAL | 1 refills | Status: DC

## 2022-10-03 MED ORDER — SODIUM CHLORIDE 0.9 % IV BOLUS
1000.0000 mL | Freq: Once | INTRAVENOUS | Status: AC
  Administered 2022-10-03: 1000 mL via INTRAVENOUS

## 2022-10-03 MED ORDER — METOCLOPRAMIDE HCL 10 MG PO TABS: 10.0000 mg | ORAL_TABLET | Freq: Three times a day (TID) | ORAL | 0 refills | Status: DC

## 2022-10-03 MED ORDER — ONDANSETRON HCL 4 MG/2ML IJ SOLN: 4.0000 mg | Freq: Once | INTRAMUSCULAR | Status: DC

## 2022-10-03 MED ORDER — SODIUM CHLORIDE 0.9 % IV BOLUS: 500.0000 mL | Freq: Once | INTRAVENOUS | Status: DC

## 2022-10-03 MED ORDER — IOHEXOL 300 MG/ML  SOLN
100.0000 mL | Freq: Once | INTRAMUSCULAR | Status: AC | PRN
  Administered 2022-10-03: 100 mL via INTRAVENOUS

## 2022-10-03 MED ORDER — DIPHENHYDRAMINE HCL 50 MG/ML IJ SOLN
25.0000 mg | Freq: Once | INTRAMUSCULAR | Status: AC
  Administered 2022-10-03: 25 mg via INTRAVENOUS
  Filled 2022-10-03: qty 1

## 2022-10-03 NOTE — ED Provider Notes (Signed)
[place] EMERGENCY DEPARTMENT AT [facility] Provider Note   CSN: [id number] Arrival date & time: 10/03/22  1505     History {Add pertinent medical, surgical, social history, OB history to HPI:1} Chief Complaint  Patient presents with   Emesis    Mary Wilson is a 57 y.o. female.   Emesis   57 year old female presents emergency department complaint of nausea, vomiting, diarrhea.  Patient states that she has been with symptoms for the past week and a half.  Was seen by primary care at [facility] earlier today and sent to the emergency department due to lower blood pressure and elevated heart rate.  Patient states she has a history of gastroparesis and has had similar flareups in the past but not lasting this long.  Patient reports recurrent episodes over the past week of lightheadedness upon standing and feeling as if she is going to pass out; symptoms seem to resolve when sitting down or when stood up for some time.  Reports some abdominal pain in epigastric region of abdomen.  Denies fever, chills, chest pain, shortness of breath, hematemesis, urinary/vaginal symptoms, hematochezia/melena.  Denies any illicit drug use.  Past medical history significant for lupus, breast cancer, diabetes mellitus, hyperlipidemia, CVA, PE, hypertension, asthma  Home Medications Prior to Admission medications   Medication Sig Start Date End Date Taking? Authorizing Provider  Acetaminophen 500 MG capsule Take by mouth. 08/15/16   [provider]  albuterol (VENTOLIN HFA) 108 (90 Base) MCG/ACT inhaler Inhale into the lungs.    [provider]  alclomethasone (ACLOVATE) 0.05 % cream APPLY TOPICALLY 2 (TWO) TIMES DAILY AS NEEDED (RASH) 12/22/21   Sheffield, Harvin Hazel R, PA-C  amLODipine (NORVASC) 5 MG tablet Take 5 mg by mouth daily. 07/30/20   [provider]  atorvastatin (LIPITOR) 40 MG tablet Take 40 mg by mouth daily.    [provider]   cholecalciferol (VITAMIN D) 1000 units tablet Take by mouth. 07/20/16   [provider]  clobetasol (OLUX) 0.05 % topical foam Apply topically 2 (two) times daily. 02/24/21   Janalyn Harder, MD  Dapsone 7.5 % GEL Apply 1 application. topically at bedtime. 09/29/21   Janalyn Harder, MD  DEXILANT 60 MG capsule Take 1 capsule by mouth daily. 06/03/20   [provider]  DULoxetine (CYMBALTA) 60 MG capsule Take 120 mg by mouth daily. 07/02/20   [provider]  esomeprazole (NEXIUM) 40 MG capsule Take 40 mg by mouth daily before breakfast.    [provider]  famotidine (PEPCID) 20 MG tablet Take 20 mg by mouth 2 (two) times daily. 06/03/20   [provider]  Fluocinolone Acetonide Body (DERMA-SMOOTHE/FS BODY) 0.01 % OIL Apply to affected area qd 06/19/21   Glyn Ade, PA-C  folic acid (FOLVITE) 800 MCG tablet Take by mouth. 09/18/16   [provider]  furosemide (LASIX) 20 MG tablet Take 20 mg by mouth daily. 06/03/20   [provider]  hydroxychloroquine (PLAQUENIL) 200 MG tablet Take by mouth. 09/01/16   [provider]  ibuprofen (ADVIL,MOTRIN) 800 MG tablet Take 800 mg by mouth every 8 (eight) hours as needed.    [provider]  lactulose (CHRONULAC) 10 GM/15ML solution  07/30/20   [provider]  LINZESS 290 MCG CAPS capsule Take 290 mcg by mouth daily. 07/30/20   [provider]  loratadine (CLARITIN) 10 MG tablet Take 10 mg by mouth daily.    [provider]  losartan (COZAAR)  25 MG tablet Take 25 mg by mouth 2 (two) times daily.    [provider]  methotrexate 50 MG/2ML injection Inject into the vein.    [provider]  metoprolol succinate (TOPROL-XL) 100 MG 24 hr tablet Take 100 mg by mouth daily. Take with or immediately following a meal.    [provider]  Multiple Vitamin (MULTIVITAMIN) capsule Take by mouth. 07/27/16   [provider]   mupirocin ointment (BACTROBAN) 2 % Apply 1 application topically 2 (two) times daily. 06/19/21   Sheffield, Judye Bos, PA-C  omeprazole (PRILOSEC) 20 MG capsule Take 20 mg by mouth daily.    [provider]  oxyCODONE-acetaminophen (PERCOCET/ROXICET) 5-325 MG tablet Take by mouth. 08/02/20   [provider]  polyethylene glycol powder (GLYCOLAX/MIRALAX) powder MIX 17 GRAMS IN LIQUID AND DRINK DAILY AS NEEDED 10/06/16   [provider]  potassium chloride (KLOR-CON) 10 MEQ tablet Take 10 mEq by mouth daily. 06/03/20   [provider]  SYMBICORT 80-4.5 MCG/ACT inhaler Inhale into the lungs. 07/30/20   [provider]  tizanidine (ZANAFLEX) 2 MG capsule Take 1 capsule (2 mg total) by mouth 3 (three) times daily. 02/28/21   Wurst, [place], PA-C  topiramate (TOPAMAX) 25 MG tablet Take 25 mg by mouth daily. 08/02/20   [provider]  traZODone (DESYREL) 150 MG tablet Take by mouth at bedtime.    [provider]  umeclidinium-vilanterol (ANORO ELLIPTA) 62.5-25 MCG/INH AEPB Inhale 1 puff into the lungs daily.    [provider]  valACYclovir (VALTREX) 1000 MG tablet Take 1,000 mg by mouth daily. 07/30/20   [provider]  valsartan-hydrochlorothiazide (DIOVAN-HCT) 320-12.5 MG tablet TAKE 1 TABLET EVERY DAY 12/25/16   [provider]  vitamin B-12 (CYANOCOBALAMIN) 1000 MCG tablet Take by mouth. 09/18/16   [provider]  Vitamin E 400 units TABS Take by mouth. 08/15/16   [provider]      Allergies    Erythromycin, Penicillins, Sulfa antibiotics, Morphine, Tetracyclines & related, and Morphine and related    Review of Systems   Review of Systems  Gastrointestinal:  Positive for vomiting.  All other systems reviewed and are negative.   Physical Exam Updated Vital Signs BP 101/79   Pulse (!) 114   Temp 98.2 F (36.8 C) (Oral)   Resp 16   Ht 5\' 7"  (1.702 m)   Wt 70.3 kg   SpO2 96%   BMI 24.28  kg/m  Physical Exam Vitals and nursing note reviewed.  Constitutional:      General: She is not in acute distress.    Appearance: She is well-developed.  HENT:     Head: Normocephalic and atraumatic.  Eyes:     Conjunctiva/sclera: Conjunctivae normal.  Cardiovascular:     Rate and Rhythm: Regular rhythm. Tachycardia present.     Heart sounds: No murmur heard. Pulmonary:     Effort: Pulmonary effort is normal. No respiratory distress.     Breath sounds: Normal breath sounds. No wheezing, rhonchi or rales.  Abdominal:     Palpations: Abdomen is soft.     Tenderness: There is abdominal tenderness.     Comments: Epigastric tenderness to palpation.  Musculoskeletal:        General: No swelling.     Cervical back: Neck supple.  Skin:    General: Skin is warm and dry.     Capillary Refill: Capillary refill takes less than 2 seconds.  Neurological:  Mental Status: She is alert.  Psychiatric:        Mood and Affect: Mood normal.     ED Results / Procedures / Treatments   Labs (all labs ordered are listed, but only abnormal results are displayed) Labs Reviewed  LIPASE, BLOOD  COMPREHENSIVE METABOLIC PANEL  CBC  URINALYSIS, ROUTINE W REFLEX MICROSCOPIC  MAGNESIUM  CBG MONITORING, ED    EKG None  Radiology No results found.  Procedures Procedures  {Document cardiac monitor, telemetry assessment procedure when appropriate:1}  Medications Ordered in ED Medications  sodium chloride 0.9 % bolus 1,000 mL (has no administration in time range)  metoCLOPramide (REGLAN) injection 10 mg (has no administration in time range)  diphenhydrAMINE (BENADRYL) injection 25 mg (has no administration in time range)    ED Course/ Medical Decision Making/ A&P   {   Click here for ABCD2, HEART and other calculatorsREFRESH Note before signing :1}                          Medical Decision Making Amount and/or Complexity of Data Reviewed Labs: ordered. Radiology:  ordered.  Risk Prescription drug management.   This patient presents to the ED for concern of nausea, vomiting, abdominal pain, this involves an extensive number of treatment options, and is a complaint that carries with it a high risk of complications and morbidity.  The differential diagnosis includes gastroparesis, hyperemesis cannabinoid syndrome, gastritis, pancreatitis, PUD, CBD pathology, cholecystitis, hepatitis, SBO/LBO, volvulus, diverticulitis, appendicitis, ***   Co morbidities that complicate the patient evaluation  See HPI   Additional history obtained:  Additional history obtained from EMR External records from outside source obtained and reviewed including hospital records   Lab Tests:  I Ordered, and personally interpreted labs.  The pertinent results include: No leukocytosis.  No evidence of anemia.  Platelets within normal range.  POC glucose of 105.***   Imaging Studies ordered:  I ordered imaging studies including CT abdomen pelvis I independently visualized and interpreted imaging which showed *** I agree with the radiologist interpretation   Cardiac Monitoring: / EKG:  The patient was maintained on a cardiac monitor.  I personally viewed and interpreted the cardiac monitored which showed an underlying rhythm of: Sinus rhythm   Consultations Obtained:  N/a   Problem List / ED Course / Critical interventions / Medication management  *** I ordered medication including ***  for ***  Reevaluation of the patient after these medicines showed that the patient {resolved/improved/worsened:23923::"improved"} I have reviewed the patients home medicines and have made adjustments as needed   Social Determinants of Health:  ***   Test / Admission - Considered:  Vitals signs significant for ***. Otherwise within normal range and stable throughout visit. Laboratory/imaging studies significant for: *** *** Worrisome signs and symptoms were discussed  with the patient, and the patient acknowledged understanding to return to the ED if noticed. Patient was stable upon discharge.    {Document critical care time when appropriate:1} {Document review of labs and clinical decision tools ie heart score, Chads2Vasc2 etc:1}  {Document your independent review of radiology images, and any outside records:1} {Document your discussion with family members, caretakers, and with consultants:1} {Document social determinants of health affecting pt's care:1} {Document your decision making why or why not admission, treatments were needed:1} Final Clinical Impression(s) / ED Diagnoses Final diagnoses:  None    Rx / DC Orders ED Discharge Orders     None

## 2022-10-03 NOTE — ED Provider Notes (Signed)
Pt's care assumed at 8pm.  Pt's ct scan shows no acute abdominal changes.  Pt given Iv fluids.  Pt able to tolerate oral fluids  Pt advised to follow up with gi. Pt given rx for reglan   [name] 10/03/22 2215    [name], MD 10/04/22 [phone]

## 2022-10-03 NOTE — Discharge Instructions (Addendum)
Follow up with your gi doctor for recheck  for recheck

## 2022-10-03 NOTE — ED Triage Notes (Signed)
Pt arrives with c/o n/v/d that started about a week ago. Pt went to see a GI doctor today and was told to come to ER due to hypotension and dizziness.

## 2022-10-04 LAB — COMPREHENSIVE METABOLIC PANEL
ALT: 6 U/L (ref 0–44)
AST: 13 U/L — ABNORMAL LOW (ref 15–41)
Albumin: 3.5 g/dL (ref 3.5–5.0)
Alkaline Phosphatase: 59 U/L (ref 38–126)
Anion gap: 18 — ABNORMAL HIGH (ref 5–15)
BUN: 10 mg/dL (ref 6–20)
CO2: 20 mmol/L — ABNORMAL LOW (ref 22–32)
Calcium: 9.7 mg/dL (ref 8.9–10.3)
Chloride: 97 mmol/L — ABNORMAL LOW (ref 98–111)
Creatinine, Ser: 1.38 mg/dL — ABNORMAL HIGH (ref 0.44–1.00)
GFR, Estimated: 45 mL/min — ABNORMAL LOW (ref >60)
Glucose, Bld: 94 mg/dL (ref 70–99)
Potassium: 3.2 mmol/L — ABNORMAL LOW (ref 3.5–5.1)
Sodium: 135 mmol/L (ref 135–145)
Total Bilirubin: 0.6 mg/dL (ref 0.3–1.2)
Total Protein: 7.5 g/dL (ref 6.5–8.1)

## 2022-10-04 LAB — LIPASE, BLOOD: Lipase: 33 U/L (ref 11–51)

## 2022-10-07 ENCOUNTER — Emergency Department (HOSPITAL_COMMUNITY): Payer: BC Managed Care – PPO

## 2022-10-07 ENCOUNTER — Inpatient Hospital Stay (HOSPITAL_COMMUNITY)
Admission: EM | Admit: 2022-10-07 | Discharge: 2022-10-12 | DRG: 074 | Disposition: A | Discharge status: Home / Self Care | Payer: BC Managed Care – PPO | Attending: Internal Medicine | Admitting: Internal Medicine

## 2022-10-07 ENCOUNTER — Other Ambulatory Visit: Payer: Self-pay

## 2022-10-07 DIAGNOSIS — E869 Volume depletion, unspecified: Secondary | ICD-10-CM | POA: Diagnosis present

## 2022-10-07 DIAGNOSIS — Z881 Allergy status to other antibiotic agents status: Secondary | ICD-10-CM

## 2022-10-07 DIAGNOSIS — Z79631 Long term (current) use of antimetabolite agent: Secondary | ICD-10-CM

## 2022-10-07 DIAGNOSIS — I10 Essential (primary) hypertension: Secondary | ICD-10-CM | POA: Diagnosis present

## 2022-10-07 DIAGNOSIS — Z880 Allergy status to penicillin: Secondary | ICD-10-CM

## 2022-10-07 DIAGNOSIS — I69354 Hemiplegia and hemiparesis following cerebral infarction affecting left non-dominant side: Secondary | ICD-10-CM

## 2022-10-07 DIAGNOSIS — H548 Legal blindness, as defined in USA: Secondary | ICD-10-CM | POA: Diagnosis present

## 2022-10-07 DIAGNOSIS — Z7951 Long term (current) use of inhaled steroids: Secondary | ICD-10-CM

## 2022-10-07 DIAGNOSIS — Z853 Personal history of malignant neoplasm of breast: Secondary | ICD-10-CM | POA: Diagnosis not present

## 2022-10-07 DIAGNOSIS — E1143 Type 2 diabetes mellitus with diabetic autonomic (poly)neuropathy: Secondary | ICD-10-CM | POA: Diagnosis not present

## 2022-10-07 DIAGNOSIS — E785 Hyperlipidemia, unspecified: Secondary | ICD-10-CM | POA: Diagnosis present

## 2022-10-07 DIAGNOSIS — Z882 Allergy status to sulfonamides status: Secondary | ICD-10-CM

## 2022-10-07 DIAGNOSIS — Z7985 Long-term (current) use of injectable non-insulin antidiabetic drugs: Secondary | ICD-10-CM

## 2022-10-07 DIAGNOSIS — L308 Other specified dermatitis: Secondary | ICD-10-CM | POA: Diagnosis present

## 2022-10-07 DIAGNOSIS — E440 Moderate protein-calorie malnutrition: Secondary | ICD-10-CM | POA: Insufficient documentation

## 2022-10-07 DIAGNOSIS — E876 Hypokalemia: Secondary | ICD-10-CM | POA: Diagnosis present

## 2022-10-07 DIAGNOSIS — R1110 Vomiting, unspecified: Secondary | ICD-10-CM | POA: Diagnosis present

## 2022-10-07 DIAGNOSIS — Z87892 Personal history of anaphylaxis: Secondary | ICD-10-CM

## 2022-10-07 DIAGNOSIS — Z90710 Acquired absence of both cervix and uterus: Secondary | ICD-10-CM

## 2022-10-07 DIAGNOSIS — Z9001 Acquired absence of eye: Secondary | ICD-10-CM

## 2022-10-07 DIAGNOSIS — K3184 Gastroparesis: Secondary | ICD-10-CM | POA: Diagnosis present

## 2022-10-07 DIAGNOSIS — Z8571 Personal history of Hodgkin lymphoma: Secondary | ICD-10-CM

## 2022-10-07 DIAGNOSIS — I951 Orthostatic hypotension: Secondary | ICD-10-CM | POA: Diagnosis not present

## 2022-10-07 DIAGNOSIS — R112 Nausea with vomiting, unspecified: Secondary | ICD-10-CM | POA: Diagnosis not present

## 2022-10-07 DIAGNOSIS — Z79899 Other long term (current) drug therapy: Secondary | ICD-10-CM

## 2022-10-07 DIAGNOSIS — J45909 Unspecified asthma, uncomplicated: Secondary | ICD-10-CM | POA: Diagnosis present

## 2022-10-07 DIAGNOSIS — Z6841 Body Mass Index (BMI) 40.0 and over, adult: Secondary | ICD-10-CM

## 2022-10-07 DIAGNOSIS — R42 Dizziness and giddiness: Secondary | ICD-10-CM

## 2022-10-07 DIAGNOSIS — Z9013 Acquired absence of bilateral breasts and nipples: Secondary | ICD-10-CM

## 2022-10-07 DIAGNOSIS — Z885 Allergy status to narcotic agent status: Secondary | ICD-10-CM

## 2022-10-07 DIAGNOSIS — E871 Hypo-osmolality and hyponatremia: Secondary | ICD-10-CM | POA: Diagnosis present

## 2022-10-07 DIAGNOSIS — K581 Irritable bowel syndrome with constipation: Secondary | ICD-10-CM | POA: Diagnosis present

## 2022-10-07 DIAGNOSIS — L930 Discoid lupus erythematosus: Secondary | ICD-10-CM | POA: Diagnosis present

## 2022-10-07 DIAGNOSIS — Z87891 Personal history of nicotine dependence: Secondary | ICD-10-CM

## 2022-10-07 LAB — COMPREHENSIVE METABOLIC PANEL
ALT: 9 U/L (ref 0–44)
AST: 14 U/L — ABNORMAL LOW (ref 15–41)
Albumin: 3.6 g/dL (ref 3.5–5.0)
Alkaline Phosphatase: 67 U/L (ref 38–126)
Anion gap: 12 (ref 5–15)
BUN: 7 mg/dL (ref 6–20)
CO2: 25 mmol/L (ref 22–32)
Calcium: 9.4 mg/dL (ref 8.9–10.3)
Chloride: 94 mmol/L — ABNORMAL LOW (ref 98–111)
Creatinine, Ser: 1.1 mg/dL — ABNORMAL HIGH (ref 0.44–1.00)
GFR, Estimated: 59 mL/min — ABNORMAL LOW (ref >60)
Glucose, Bld: 106 mg/dL — ABNORMAL HIGH (ref 70–99)
Potassium: 2.8 mmol/L — ABNORMAL LOW (ref 3.5–5.1)
Sodium: 131 mmol/L — ABNORMAL LOW (ref 135–145)
Total Bilirubin: 0.8 mg/dL (ref 0.3–1.2)
Total Protein: 7.8 g/dL (ref 6.5–8.1)

## 2022-10-07 LAB — URINALYSIS, ROUTINE W REFLEX MICROSCOPIC
Glucose, UA: NEGATIVE mg/dL
Hgb urine dipstick: NEGATIVE
Ketones, ur: 20 mg/dL — AB
Leukocytes,Ua: NEGATIVE
Nitrite: NEGATIVE
Protein, ur: 100 mg/dL — AB
Specific Gravity, Urine: 1.03 (ref 1.005–1.030)
pH: 5 (ref 5.0–8.0)

## 2022-10-07 LAB — CBC
HCT: 40.6 % (ref 36.0–46.0)
Hemoglobin: 13.7 g/dL (ref 12.0–15.0)
MCH: 27.1 pg (ref 26.0–34.0)
MCHC: 33.7 g/dL (ref 30.0–36.0)
MCV: 80.4 fL (ref 80.0–100.0)
Platelets: 334 10*3/uL (ref 150–400)
RBC: 5.05 MIL/uL (ref 3.87–5.11)
RDW: 16 % — ABNORMAL HIGH (ref 11.5–15.5)
WBC: 7.2 10*3/uL (ref 4.0–10.5)
nRBC: 0 % (ref 0.0–0.2)

## 2022-10-07 LAB — LIPASE, BLOOD: Lipase: 22 U/L (ref 11–51)

## 2022-10-07 MED ORDER — UMECLIDINIUM-VILANTEROL 62.5-25 MCG/ACT IN AEPB
1.0000 | INHALATION_SPRAY | Freq: Every day | RESPIRATORY_TRACT | Status: DC
  Administered 2022-10-08 – 2022-10-12 (×5): 1 via RESPIRATORY_TRACT
  Filled 2022-10-07: qty 14

## 2022-10-07 MED ORDER — AMITRIPTYLINE HCL 25 MG PO TABS
150.0000 mg | ORAL_TABLET | Freq: Every day | ORAL | Status: DC
  Administered 2022-10-07 – 2022-10-11 (×5): 150 mg via ORAL
  Filled 2022-10-07 (×5): qty 6

## 2022-10-07 MED ORDER — PANTOPRAZOLE SODIUM 40 MG IV SOLR
40.0000 mg | Freq: Two times a day (BID) | INTRAVENOUS | Status: DC
  Administered 2022-10-07 – 2022-10-12 (×10): 40 mg via INTRAVENOUS
  Filled 2022-10-07 (×10): qty 10

## 2022-10-07 MED ORDER — METOPROLOL SUCCINATE ER 50 MG PO TB24
100.0000 mg | ORAL_TABLET | Freq: Every day | ORAL | Status: DC
  Administered 2022-10-08 – 2022-10-12 (×5): 100 mg via ORAL
  Filled 2022-10-07 (×5): qty 2

## 2022-10-07 MED ORDER — TOPIRAMATE 25 MG PO TABS
50.0000 mg | ORAL_TABLET | Freq: Every day | ORAL | Status: DC
  Administered 2022-10-07 – 2022-10-12 (×6): 50 mg via ORAL
  Filled 2022-10-07 (×6): qty 2

## 2022-10-07 MED ORDER — ONDANSETRON HCL 4 MG/2ML IJ SOLN
4.0000 mg | Freq: Once | INTRAMUSCULAR | Status: AC
  Administered 2022-10-07: 4 mg via INTRAVENOUS
  Filled 2022-10-07: qty 2

## 2022-10-07 MED ORDER — SODIUM CHLORIDE 0.9 % IV BOLUS
1000.0000 mL | Freq: Once | INTRAVENOUS | Status: AC
  Administered 2022-10-07: 1000 mL via INTRAVENOUS

## 2022-10-07 MED ORDER — TRAZODONE HCL 50 MG PO TABS
150.0000 mg | ORAL_TABLET | Freq: Two times a day (BID) | ORAL | Status: DC
  Administered 2022-10-07: 150 mg via ORAL
  Filled 2022-10-07 (×2): qty 1

## 2022-10-07 MED ORDER — ENOXAPARIN SODIUM 40 MG/0.4ML IJ SOSY
40.0000 mg | PREFILLED_SYRINGE | INTRAMUSCULAR | Status: DC
  Administered 2022-10-07 – 2022-10-11 (×5): 40 mg via SUBCUTANEOUS
  Filled 2022-10-07 (×5): qty 0.4

## 2022-10-07 MED ORDER — ONDANSETRON HCL 4 MG/2ML IJ SOLN
4.0000 mg | Freq: Once | INTRAMUSCULAR | Status: AC | PRN
  Administered 2022-10-07: 4 mg via INTRAVENOUS
  Filled 2022-10-07: qty 2

## 2022-10-07 MED ORDER — TOPIRAMATE 25 MG PO TABS: 25.0000 mg | ORAL_TABLET | Freq: Every day | ORAL | Status: DC

## 2022-10-07 MED ORDER — ONDANSETRON HCL 4 MG/2ML IJ SOLN
4.0000 mg | Freq: Four times a day (QID) | INTRAMUSCULAR | Status: DC | PRN
  Administered 2022-10-08 – 2022-10-11 (×4): 4 mg via INTRAVENOUS
  Filled 2022-10-07 (×4): qty 2

## 2022-10-07 MED ORDER — ONDANSETRON HCL 4 MG PO TABS: 4.0000 mg | ORAL_TABLET | Freq: Four times a day (QID) | ORAL | Status: DC | PRN

## 2022-10-07 MED ORDER — INSULIN ASPART 100 UNIT/ML IJ SOLN: 0.0000 [IU] | Freq: Three times a day (TID) | INTRAMUSCULAR | Status: DC

## 2022-10-07 MED ORDER — MOMETASONE FURO-FORMOTEROL FUM 100-5 MCG/ACT IN AERO
2.0000 | INHALATION_SPRAY | Freq: Two times a day (BID) | RESPIRATORY_TRACT | Status: DC
  Administered 2022-10-08 – 2022-10-12 (×9): 2 via RESPIRATORY_TRACT
  Filled 2022-10-07: qty 8.8

## 2022-10-07 MED ORDER — TIZANIDINE HCL 2 MG PO TABS
2.0000 mg | ORAL_TABLET | Freq: Three times a day (TID) | ORAL | Status: DC
  Administered 2022-10-07 – 2022-10-12 (×14): 2 mg via ORAL
  Filled 2022-10-07 (×14): qty 1

## 2022-10-07 MED ORDER — POTASSIUM CHLORIDE 10 MEQ/100ML IV SOLN
10.0000 meq | INTRAVENOUS | Status: AC
  Administered 2022-10-07 (×2): 10 meq via INTRAVENOUS
  Filled 2022-10-07 (×2): qty 100

## 2022-10-07 MED ORDER — ALBUTEROL SULFATE (2.5 MG/3ML) 0.083% IN NEBU: 2.5000 mg | INHALATION_SOLUTION | RESPIRATORY_TRACT | Status: DC | PRN

## 2022-10-07 MED ORDER — DAPSONE 7.5 % EX GEL: 1.0000 "application " | Freq: Every day | CUTANEOUS | Status: DC

## 2022-10-07 MED ORDER — METOCLOPRAMIDE HCL 5 MG/ML IJ SOLN
5.0000 mg | Freq: Three times a day (TID) | INTRAMUSCULAR | Status: DC | PRN
  Administered 2022-10-08: 5 mg via INTRAVENOUS
  Filled 2022-10-07: qty 2

## 2022-10-07 MED ORDER — SODIUM CHLORIDE 0.45 % IV SOLN
INTRAVENOUS | Status: DC
  Filled 2022-10-07 (×6): qty 1000

## 2022-10-07 NOTE — Hospital Course (Addendum)
Patient is a 57 years old female with past medical history of  gastroparesis, IBS with constipation, history of previous breast cancer,lymphoma, lupus, legal blindness, asthma, CVA and hypertension who presented to hospital with complaints of persistent nausea and vomiting.  Patient was seen in the ED few days back when she was prescribed Reglan but despite that she was unable to keep anything down.  Patient states that she has not had the much to eat over the last 1 week or so.  Has intermittent vomiting from gastroparesis.  Has been having dizziness on standing positions for the last 2 weeks.  She had been lately feeling lightheaded especially from sitting to standing position.  Patient also complained of generalized abdominal cramps with multiple loose bowel movements.  She was seen at [facility] and was noted to have hypotension and tachycardia and orthostatic.  Patient denied any recent use of antibiotics or recent travels.  No concern for food poisoning.  Denies any urinary urgency, frequency or dysuria but decreased urination..  Complains of generalized weakness at this time.  No focal weakness headache.  Patient denies any shortness of breath, cough, fever, chills or rigor.   In the ED, patient was afebrile but had significant orthostatic hypotension with 40 point drop. CBC with no leukocytosis.  Labs showed hyponatremia with sodium of 131 with potassium low at 2.8.  Creatinine at 1.1.  Urinalysis showed ketones but negative for nitrate.  White cells were 11-20 per high-power field.  Patient received IV fluid bolus 2 L, potassium 20 mEq, Zofran x 2 and was considered for admission to the hospital for further evaluation and treatment.   Assessment and plan.  Intractable nausea vomiting.  Some diarrhea.  History of gastroparesis.  GI pathogen and C. difficile panel has been sent.  Continue Zofran.  Can consider Reglan if intractable vomiting.  Continue hydration with half-normal saline and  KCl..  Started on clears if tolerated.  Continue PPI.  Hold laxatives for now.  Check hemoglobin A1c.  History of diabetes.  Patient is currently on Mounjaro.  Likely symptoms from diabetic gastroparesis.  Will consider sliding scale insulin while in hospital.  Hypokalemia.  Potassium of 2.8.  Continue IV hydration with KCl.  Received 20 mill equivalents of IV potassium in the ED.  Check BMP in AM.  Check magnesium level in a.m and now.  Orthostatic hypotension secondary to volume depletion nausea and vomiting with multiple antihypertensives at home..  Continue with hydration.  Patient received 2 L of IV fluid in the ED.  Will continue with 150 mL/h while in the hospital.  Monitor orthostatic blood pressure every shift.  Check TSH.  Essential hypertension.  Patient is on amlodipine, Lasix, losartan, metoprolol, valsartan HCTZ as outpatient will hold.  Hold diuretics.  Currently orthostatic hypotension.  Continue IV hydration.  Hyperlipidemia.  Hold Lipitor for now.  Right eye blindness status post prosthesis.  History of breast cancer lymphoma.  Asthma.  Resume albuterol inhaler, Symbicort from home.  Currently compensated.

## 2022-10-07 NOTE — ED Triage Notes (Signed)
Pt arrived via POV. C/o N/V/D for 2x weeks. Was seen 4x days ago and treated then dc'd. Pt states they have had no improvement. Have not been able to tolerate any po intake.  AOx4

## 2022-10-07 NOTE — ED Notes (Signed)
ED TO INPATIENT HANDOFF REPORT  Name/Age/Gender Mary Wilson 57 y.o. female  Code Status    Code Status Orders  (From admission, onward)           Start     Ordered   10/07/22 2028  Full code  Continuous       Question:  By:  Answer:  Consent: discussion documented in EHR   10/07/22 2036           Code Status History     This patient has a current code status but no historical code status.       Home/SNF/Other Home  Chief Complaint Orthostatic hypotension [I95.1]  Level of Care/Admitting Diagnosis ED Disposition     ED Disposition  Admit   Condition  --   Comment  Hospital Area: [facility] [[id number]]  Level of Care: Telemetry [5]  Admit to tele based on following criteria: Monitor QTC interval  May place patient in observation at [facility] or [facility] if equivalent level of care is available:: No  Covid Evaluation: Asymptomatic - no recent exposure (last 10 days) testing not required  Diagnosis: Orthostatic hypotension [458.0.ICD-9-CM]  Admitting Physician: Joycelyn Das [[id number]]  Attending Physician: Joycelyn Das [[id number]]          Medical History Past Medical History:  Diagnosis Date   Asthma    CVA (cerebral infarction)    Diabetes mellitus without complication    now under control; no meds as of 04/26/16   Esophageal reflux    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    Hodgkin disease    Hodgkin's disease    HTN (hypertension)    Hyperlipidemia    Intractable migraine    Legally blind    Lupus erythematosus    Muscle spasticity    Stroke    Vision abnormalities    Vitamin D deficiency     Allergies Allergies  Allergen Reactions   Erythromycin Nausea And Vomiting and Shortness Of Breath   Penicillins Anaphylaxis   Sulfa Antibiotics Anaphylaxis and Nausea And Vomiting   Morphine Other (See Comments)    insomnia   Tetracyclines & Related Nausea And Vomiting    Morphine And Related Other (See Comments)    Stop breathing    IV Location/Drains/Wounds Patient Lines/Drains/Airways Status     Active Line/Drains/Airways     Name Placement date Placement time Site Days   Peripheral IV 10/07/22 20 G Anterior;Right Forearm 10/07/22  1543  Forearm  less than 1            Labs/Imaging Results for orders placed or performed during the hospital encounter of 10/07/22 (from the past 48 hour(s))  Lipase, blood     Status: None   Collection Time: 10/07/22  1:12 PM  Result Value Ref Range   Lipase 22 11 - 51 U/L    Comment: Performed at [facility], [address], [place], [place] [address]  Comprehensive metabolic panel     Status: Abnormal   Collection Time: 10/07/22  1:12 PM  Result Value Ref Range   Sodium 131 (L) 135 - 145 mmol/L   Potassium 2.8 (L) 3.5 - 5.1 mmol/L   Chloride 94 (L) 98 - 111 mmol/L   CO2 25 22 - 32 mmol/L   Glucose, Bld 106 (H) 70 - 99 mg/dL    Comment: Glucose reference range applies only to samples taken after fasting for at least 8 hours.  BUN 7 6 - 20 mg/dL   Creatinine, Ser 1.61 (H) 0.44 - 1.00 mg/dL   Calcium 9.4 8.9 - 09.6 mg/dL   Total Protein 7.8 6.5 - 8.1 g/dL   Albumin 3.6 3.5 - 5.0 g/dL   AST 14 (L) 15 - 41 U/L   ALT 9 0 - 44 U/L   Alkaline Phosphatase 67 38 - 126 U/L   Total Bilirubin 0.8 0.3 - 1.2 mg/dL   GFR, Estimated 59 (L) >60 mL/min    Comment: (NOTE) Calculated using the CKD-EPI Creatinine Equation (2021)    Anion gap 12 5 - 15    Comment: Performed at [facility], [address], [place], [place] [address]  CBC     Status: Abnormal   Collection Time: 10/07/22  1:12 PM  Result Value Ref Range   WBC 7.2 4.0 - 10.5 K/uL   RBC 5.05 3.87 - 5.11 MIL/uL   Hemoglobin 13.7 12.0 - 15.0 g/dL   HCT 98.1 19.1 - 47.8 %   MCV 80.4 80.0 - 100.0 fL   MCH 27.1 26.0 - 34.0 pg   MCHC 33.7 30.0 - 36.0 g/dL   RDW 29.5 (H) 62.1 - 30.8 %   Platelets 334 150 - 400 K/uL    nRBC 0.0 0.0 - 0.2 %    Comment: Performed at [facility], [address], [place], [place] [address]  Urinalysis, Routine w reflex microscopic -Urine, Clean Catch     Status: Abnormal   Collection Time: 10/07/22  1:12 PM  Result Value Ref Range   Color, Urine AMBER (A) YELLOW    Comment: BIOCHEMICALS MAY BE AFFECTED BY COLOR   APPearance HAZY (A) CLEAR   Specific Gravity, Urine 1.030 1.005 - 1.030   pH 5.0 5.0 - 8.0   Glucose, UA NEGATIVE NEGATIVE mg/dL   Hgb urine dipstick NEGATIVE NEGATIVE   Bilirubin Urine MODERATE (A) NEGATIVE   Ketones, ur 20 (A) NEGATIVE mg/dL   Protein, ur 696 (A) NEGATIVE mg/dL   Nitrite NEGATIVE NEGATIVE   Leukocytes,Ua NEGATIVE NEGATIVE   RBC / HPF 0-5 0 - 5 RBC/hpf   WBC, UA 11-20 0 - 5 WBC/hpf   Bacteria, UA RARE (A) NONE SEEN   Squamous Epithelial / HPF 11-20 0 - 5 /HPF   Mucus PRESENT    Hyaline Casts, UA PRESENT    Ca Oxalate Crys, UA PRESENT     Comment: Performed at [facility], [address], [place], [place] [address]   DG Abdomen 1 View  Result Date: 10/07/2022 CLINICAL DATA:  Pain.  Emesis EXAM: ABDOMEN - 1 VIEW COMPARISON:  None Available. FINDINGS: Battery pack along the upper right hemipelvis with a stimulator lead along the right hemipelvis. Scattered surgical clips. Scattered degenerative changes along the spine. Gas seen along nondilated loops of small and large bowel. Scattered stool. No obstruction. Gas and stool along the rectum. No obvious free air on this supine radiograph. IMPRESSION: Nonspecific bowel gas pattern with some scattered gas and stool. Surgical changes.  Sacral stimulator. Electronically Signed   By: Karen Kays M.D.   On: 10/07/2022 17:15    Pending Labs Unresulted Labs (From admission, onward)     Start     Ordered   10/08/22 0500  Hemoglobin A1c  Tomorrow morning,   R        10/07/22 2036   10/08/22 0500  TSH  Tomorrow morning,   R  10/07/22 2036   10/08/22 0500   Comprehensive metabolic panel  Tomorrow morning,   R        10/07/22 2036   10/08/22 0500  CBC  Tomorrow morning,   R        10/07/22 2036   10/08/22 0500  Magnesium  Tomorrow morning,   R        10/07/22 2036   10/07/22 2033  Magnesium  Add-on,   AD        10/07/22 2032   10/07/22 2027  HIV Antibody (routine testing w rflx)  (HIV Antibody (Routine testing w reflex) panel)  Once,   R        10/07/22 2036   10/07/22 1534  Gastrointestinal Panel by PCR , Stool  (Gastrointestinal Panel by PCR, Stool                                                                                                                                                     **Does Not include CLOSTRIDIUM DIFFICILE testing. **If CDIFF testing is needed, place order from the "C Difficile Testing" order set.**)  Once,   URGENT        10/07/22 1534   10/07/22 1534  C Difficile Quick Screen w PCR reflex  (C Difficile quick screen w PCR reflex panel )  Once, for 24 hours,   URGENT       References:    CDiff Information Tool   10/07/22 1534            Vitals/Pain Today's Vitals   10/07/22 1700 10/07/22 1730 10/07/22 1800 10/07/22 2017  BP: 93/78 (!) 125/90 (!) 121/90   Pulse: 88  85   Resp: (!) Temp:    98.5 F (36.9 C)  TempSrc:    Oral  SpO2: 98%  96%   PainSc:        Isolation Precautions Enteric precautions (UV disinfection)  Medications Medications  tizanidine (ZANAFLEX) capsule 2 mg (has no administration in time range)  topiramate (TOPAMAX) tablet 25 mg (has no administration in time range)  albuterol (VENTOLIN HFA) 108 (90 Base) MCG/ACT inhaler 2 puff (has no administration in time range)  umeclidinium-vilanterol (ANORO ELLIPTA) 62.5-25 MCG/INH 1 puff (has no administration in time range)  enoxaparin (LOVENOX) injection 40 mg (has no administration in time range)  sodium chloride 0.45 % 1,000 mL with potassium chloride 40 mEq infusion (has no administration in time range)  ondansetron  (ZOFRAN) tablet 4 mg (has no administration in time range)    Or  ondansetron (ZOFRAN) injection 4 mg (has no administration in time range)  ondansetron (ZOFRAN) injection 4 mg (4 mg Intravenous Given 10/07/22 1543)  sodium chloride 0.9 % bolus 1,000 mL (0 mLs Intravenous Stopped 10/07/22 1830)  potassium chloride 10 mEq in 100 mL  IVPB (0 mEq Intravenous Stopped 10/07/22 1830)  sodium chloride 0.9 % bolus 1,000 mL (1,000 mLs Intravenous New Bag/Given 10/07/22 1832)  ondansetron (ZOFRAN) injection 4 mg (4 mg Intravenous Given 10/07/22 1833)    Mobility walks

## 2022-10-07 NOTE — ED Provider Notes (Signed)
[place] EMERGENCY DEPARTMENT AT [facility] Provider Note   CSN: [id number] Arrival date & time: 10/07/22  1301    History  Chief Complaint  Patient presents with   Emesis   Diarrhea    Mary Wilson is a 57 y.o. female with history of legal blindness, asthma, migraine, IBS with constipation, prior breast cancer, CVA, hypertension here for evaluation of persistent nausea and vomiting.  Seen here 4 days ago prescribed Reglan.  Is still unable to keep down any p.o. liquids.  She feels lightheaded when she goes from sitting to standing, resolves when she sits back down.  No chest pain, shortness of breath, syncope.  Some generalized abdominal pain.  She has had multiple episodes of loose stool without any blood.  No recent travel, antibiotic use.  Unable to quantify how many episodes.  No sick contacts, suspicious food intake.  No fever, dysuria or hematuria.  Does not take any blood thinners.  Does have history of diabetes however is unsure if she has history of gastroparesis. Notes from [facility] states hx of gastroparesis. Feels generally weak. No unilateral weakness, HA, numbness. Seen by PCP and GI again since DC and sent back to the ED due to persistent emesis.  Zofran at home Trial Reglan taken off by GI due to possible serotonin syndrome risk   HPI     Home Medications Prior to Admission medications   Medication Sig Start Date End Date Taking? Authorizing Provider  Acetaminophen 500 MG capsule Take by mouth. 08/15/16   [provider]  albuterol (VENTOLIN HFA) 108 (90 Base) MCG/ACT inhaler Inhale into the lungs.    [provider]  alclomethasone (ACLOVATE) 0.05 % cream APPLY TOPICALLY 2 (TWO) TIMES DAILY AS NEEDED (RASH) 12/22/21   Sheffield, Harvin Hazel R, PA-C  amLODipine (NORVASC) 5 MG tablet Take 5 mg by mouth daily. 07/30/20   [provider]  atorvastatin (LIPITOR) 40 MG tablet Take 40 mg by mouth daily.    [provider]   cholecalciferol (VITAMIN D) 1000 units tablet Take by mouth. 07/20/16   [provider]  clobetasol (OLUX) 0.05 % topical foam Apply topically 2 (two) times daily. 02/24/21   Janalyn Harder, MD  Dapsone 7.5 % GEL Apply 1 application. topically at bedtime. 09/29/21   Janalyn Harder, MD  DEXILANT 60 MG capsule Take 1 capsule by mouth daily. 06/03/20   [provider]  DULoxetine (CYMBALTA) 60 MG capsule Take 120 mg by mouth daily. 07/02/20   [provider]  esomeprazole (NEXIUM) 40 MG capsule Take 40 mg by mouth daily before breakfast.    [provider]  famotidine (PEPCID) 20 MG tablet Take 20 mg by mouth 2 (two) times daily. 06/03/20   [provider]  Fluocinolone Acetonide Body (DERMA-SMOOTHE/FS BODY) 0.01 % OIL Apply to affected area qd 06/19/21   Glyn Ade, PA-C  folic acid (FOLVITE) 800 MCG tablet Take by mouth. 09/18/16   [provider]  furosemide (LASIX) 20 MG tablet Take 20 mg by mouth daily. 06/03/20   [provider]  hydroxychloroquine (PLAQUENIL) 200 MG tablet Take by mouth. 09/01/16   [provider]  ibuprofen (ADVIL,MOTRIN) 800 MG tablet Take 800 mg by mouth every 8 (eight) hours as needed.    [provider]  lactulose (CHRONULAC) 10 GM/15ML solution  07/30/20   [provider]  LINZESS 290 MCG CAPS capsule Take 290 mcg by mouth daily. 07/30/20   [provider]  loratadine (  CLARITIN) 10 MG tablet Take 10 mg by mouth daily.    [provider]  losartan (COZAAR) 25 MG tablet Take 25 mg by mouth 2 (two) times daily.    [provider]  methotrexate 50 MG/2ML injection Inject into the vein.    [provider]  metoCLOPramide (REGLAN) 10 MG tablet Take 1 tablet (10 mg total) by mouth 3 (three) times daily with meals. 10/03/22 10/03/23  Elson Areas, PA-C  metoCLOPramide (REGLAN) 10 MG tablet Take 1 tablet (10 mg total) by mouth 3 (three) times daily with  meals. 10/03/22 10/03/23  Elson Areas, PA-C  metoprolol succinate (TOPROL-XL) 100 MG 24 hr tablet Take 100 mg by mouth daily. Take with or immediately following a meal.    [provider]  Multiple Vitamin (MULTIVITAMIN) capsule Take by mouth. 07/27/16   [provider]  mupirocin ointment (BACTROBAN) 2 % Apply 1 application topically 2 (two) times daily. 06/19/21   Sheffield, Judye Bos, PA-C  omeprazole (PRILOSEC) 20 MG capsule Take 20 mg by mouth daily.    [provider]  oxyCODONE-acetaminophen (PERCOCET/ROXICET) 5-325 MG tablet Take by mouth. 08/02/20   [provider]  polyethylene glycol powder (GLYCOLAX/MIRALAX) powder MIX 17 GRAMS IN LIQUID AND DRINK DAILY AS NEEDED 10/06/16   [provider]  potassium chloride (KLOR-CON) 10 MEQ tablet Take 10 mEq by mouth daily. 06/03/20   [provider]  SYMBICORT 80-4.5 MCG/ACT inhaler Inhale into the lungs. 07/30/20   [provider]  tizanidine (ZANAFLEX) 2 MG capsule Take 1 capsule (2 mg total) by mouth 3 (three) times daily. 02/28/21   Wurst, [place], PA-C  topiramate (TOPAMAX) 25 MG tablet Take 25 mg by mouth daily. 08/02/20   [provider]  traZODone (DESYREL) 150 MG tablet Take by mouth at bedtime.    [provider]  umeclidinium-vilanterol (ANORO ELLIPTA) 62.5-25 MCG/INH AEPB Inhale 1 puff into the lungs daily.    [provider]  valACYclovir (VALTREX) 1000 MG tablet Take 1,000 mg by mouth daily. 07/30/20   [provider]  valsartan-hydrochlorothiazide (DIOVAN-HCT) 320-12.5 MG tablet TAKE 1 TABLET EVERY DAY 12/25/16   [provider]  vitamin B-12 (CYANOCOBALAMIN) 1000 MCG tablet Take by mouth. 09/18/16   [provider]  Vitamin E 400 units TABS Take by mouth. 08/15/16   [provider]      Allergies    Erythromycin, Penicillins, Sulfa antibiotics, Morphine, Tetracyclines & related, and Morphine and related    Review of  Systems   Review of Systems  Constitutional: Negative.   HENT: Negative.    Respiratory: Negative.    Cardiovascular: Negative.   Gastrointestinal:  Positive for abdominal pain, diarrhea, nausea and vomiting. Negative for constipation.  Genitourinary: Negative.   Musculoskeletal: Negative.   Skin: Negative.   Neurological:  Positive for weakness and light-headedness (orthostatic).  All other systems reviewed and are negative.   Physical Exam Updated Vital Signs BP (!) 121/90   Pulse 85   Temp 98.3 F (36.8 C)   Resp 16   SpO2 96%  Physical Exam Vitals and nursing note reviewed.  Constitutional:      General: She is not in acute distress.    Appearance: She is well-developed. She is not ill-appearing.     Comments: Dry mucous membranes  HENT:     Head: Normocephalic and atraumatic.     Nose: Nose normal.     Mouth/Throat:     Mouth: Mucous membranes are moist.  Eyes:     Pupils: Pupils are equal, round, and reactive to light.  Cardiovascular:     Rate and Rhythm: Normal rate.     Pulses: Normal pulses.     Heart sounds: Normal heart sounds.  Pulmonary:     Effort: Pulmonary effort is normal. No respiratory distress.     Breath sounds: Normal breath sounds.  Abdominal:     General: Bowel sounds are normal. There is no distension.     Palpations: Abdomen is soft.     Tenderness: There is no abdominal tenderness. There is no right CVA tenderness, left CVA tenderness, guarding or rebound. Negative signs include Murphy's sign and McBurney's sign.     Hernia: No hernia is present.     Comments: Soft non tender  Musculoskeletal:        General: Normal range of motion.     Cervical back: Normal range of motion.  Skin:    General: Skin is warm and dry.  Neurological:     General: No focal deficit present.     Mental Status: She is alert.  Psychiatric:        Mood and Affect: Mood normal.    ED Results / Procedures / Treatments   Labs (all labs ordered are listed,  but only abnormal results are displayed) Labs Reviewed  COMPREHENSIVE METABOLIC PANEL - Abnormal; Notable for the following components:      Result Value   Sodium 131 (*)    Potassium 2.8 (*)    Chloride 94 (*)    Glucose, Bld 106 (*)    Creatinine, Ser 1.10 (*)    AST 14 (*)    GFR, Estimated 59 (*)    All other components within normal limits  CBC - Abnormal; Notable for the following components:   RDW 16.0 (*)    All other components within normal limits  URINALYSIS, ROUTINE W REFLEX MICROSCOPIC - Abnormal; Notable for the following components:   Color, Urine AMBER (*)    APPearance HAZY (*)    Bilirubin Urine MODERATE (*)    Ketones, ur 20 (*)    Protein, ur 100 (*)    Bacteria, UA RARE (*)    All other components within normal limits  GASTROINTESTINAL PANEL BY PCR, STOOL (REPLACES STOOL CULTURE)  C DIFFICILE QUICK SCREEN W PCR REFLEX    LIPASE, BLOOD    EKG None  Radiology DG Abdomen 1 View  Result Date: 10/07/2022 CLINICAL DATA:  Pain.  Emesis EXAM: ABDOMEN - 1 VIEW COMPARISON:  None Available. FINDINGS: Battery pack along the upper right hemipelvis with a stimulator lead along the right hemipelvis. Scattered surgical clips. Scattered degenerative changes along the spine. Gas seen along nondilated loops of small and large bowel. Scattered stool. No obstruction. Gas and stool along the rectum. No obvious free air on this supine radiograph. IMPRESSION: Nonspecific bowel gas pattern with some scattered gas and stool. Surgical changes.  Sacral stimulator. Electronically Signed   By: Karen Kays M.D.   On: 10/07/2022 17:15    Procedures Procedures    Medications Ordered in ED Medications  ondansetron (ZOFRAN) injection 4 mg (4 mg Intravenous Given 10/07/22 1543)  sodium chloride 0.9 % bolus 1,000 mL (0 mLs Intravenous Stopped 10/07/22 1830)  potassium chloride 10 mEq in 100 mL IVPB (0 mEq Intravenous Stopped 10/07/22 1830)  sodium chloride 0.9 % bolus 1,000 mL (1,000 mLs  Intravenous New Bag/Given 10/07/22 1832)  ondansetron (ZOFRAN) injection 4 mg (4 mg Intravenous Given  10/07/22 1833)   ED Course/ Medical Decision Making/ A&P Clinical Course as of 10/07/22 2017  Wed Oct 07, 2022  2012 Pokhrel with [facility] will evaluate for admissio [BH]    Clinical Course User Index [BH] Mary Wilson A, PA-C   57 year old with history of diabetes, lupus, legal blindness, asthma, IBS-C here for evaluation of persistent nausea and vomiting.  Began approximately a week and a half ago.  Was seen in the ED 4 days ago for persistent nausea, vomiting was sent home medication.  Had CT scan which I reviewed did not show any significant findings.  Symptoms he thought were likely due to paraparesis.  She continues to be unable to tolerate any p.o. intake at home.  Has some lightheadedness which sounds orthostasis in nature.  She was seen by her PCP and had some low blood pressures due to inability to p.o. intake was sent here.  Had some loose stool however is unable to quantify.  Denies any bright red blood or melena.  No recent suspicious food intake, travel, sick contacts, antibiotic use.  No history of C. difficile.  Plan on labs, imaging, treat symptoms and reassess  Labs and imaging personally viewed and interpreted:  CBC without leukocytosis CMP sodium 131, potassium 2.8, creatinine 1.1 UA neg for infection Lipase 22 DG abd xray no obstruction, free air  Reassessed.  Discussed labs and imaging.  Suspect flare of gastroparesis.  Notes from [facility] whom sent her here.  Dry heaving persistent.  Feels lightheaded when she stands.  Will give additional antiemetic, IV fluids.  Will check orthostatic vital signs.  Patient persistent lightheadedness with ambulation despite IVF, 30 point drop in systolic BP.  Persistent retching.  Attempted p.o. water, gagging.  Will admit for further management.  CONSULT Dr. Tyson Babinski with [facility] will evaluate patient for admission  The patient appears  reasonably stabilized for admission considering the current resources, flow, and capabilities available in the ED at this time, and I doubt any other [facility] requiring further screening and/or treatment in the ED prior to admission.                               Medical Decision Making Amount and/or Complexity of Data Reviewed Independent Historian: friend External Data Reviewed: labs, radiology, ECG and notes. Labs: ordered. Decision-making details documented in ED Course. Radiology: ordered and independent interpretation performed. Decision-making details documented in ED Course. ECG/medicine tests: ordered and independent interpretation performed. Decision-making details documented in ED Course.  Risk OTC drugs. Prescription drug management. Parenteral controlled substances. Decision regarding hospitalization. Diagnosis or treatment significantly limited by social determinants of health.           Final Clinical Impression(s) / ED Diagnoses Final diagnoses:  Hypokalemia  Orthostatic dizziness  Nausea and vomiting, unspecified vomiting type  Gastroparesis    Rx / DC Orders ED Discharge Orders     None         Eliyanna Ault A, PA-C 10/07/22 2017    Arby Barrette, MD 10/08/22 1549

## 2022-10-07 NOTE — H&P (Signed)
[facility] History and Physical  Mary Wilson [place] ZOX:[id number] DOB: December 11, 1965 DOA: 10/07/2022  Referring physician: ED  PCP: [name], NP   Patient is coming from: Home  Chief Complaint: Nausea and vomiting dizziness.  HPI:  Patient is a 57 years old female with past medical history of  gastroparesis, IBS with constipation, history of previous breast cancer,lymphoma, lupus, legal blindness, asthma, CVA and hypertension who presented to hospital with complaints of persistent nausea and vomiting.  Patient was seen in the ED few days back when she was prescribed Reglan but despite that she was unable to keep anything down.  Patient states that she has not had the much to eat over the last 1 week or so.  Has intermittent vomiting from gastroparesis.  Has been having dizziness on standing positions for the last 2 weeks with an near syncopal episode.  Patient also complained of generalized abdominal cramps with multiple loose bowel movements, states that her bowel movements look like urination.Marland Kitchen  She was seen at [facility] and was noted to have hypotension and tachycardia and orthostatic.  Patient denied any recent use of antibiotics or recent travels.  No concern for food poisoning.  Denies any urinary urgency, frequency or dysuria but decreased urination..  Complains of generalized weakness at this time.  No focal weakness headache.  Patient denies any shortness of breath, cough, fever, chills or rigor.   In the ED, patient was afebrile but had significant orthostatic hypotension with 40 point drop. CBC with no leukocytosis.  Labs showed hyponatremia with sodium of 131 with potassium low at 2.8.  Creatinine at 1.1.  Urinalysis showed ketones but negative for nitrate.  White cells were 11-20 per high-power field.  Patient received IV fluid bolus 2 L, potassium 20 mEq, Zofran x 2 and was considered for admission to the hospital for further evaluation and  treatment.  Assessment and plan.  Principal Problem:   Orthostatic hypotension Active Problems:   History of breast cancer   Hyperlipidemia   History of Hodgkin's disease   Essential hypertension   BMI 40.0-44.9, adult   Vomiting   Intractable nausea vomiting, diarrhea..  History of gastroparesis.  GI pathogen and C. difficile panel has been sent.  Continue Zofran.  Can consider Reglan if intractable vomiting or Phenergan if diarrhea worsens..  Continue hydration with half-normal saline and KCl..  Started on clears if tolerated.  Continue PPI.  Hold laxatives for now.  Check hemoglobin A1c.  History of diabetes type II.Marland Kitchen  Patient is currently on Mounjaro.  Likely symptoms from diabetic gastroparesis.  Will consider sliding scale insulin while in hospital.  Last hemoglobin A1c of 6.5 as per the patient.  Will repeat A1c while in the hospital.  Hypokalemia.  Potassium of 2.8.  Continue IV hydration with KCl.  Received 20 mill equivalents of IV potassium in the ED.  Check BMP in AM.  Check magnesium level in a.m and now.  Orthostatic hypotension secondary to volume depletion nausea and vomiting with multiple antihypertensives at home..  Continue with hydration.  Patient received 2 L of IV fluid in the ED.  Will continue with 150 mL/h while in the hospital.  Monitor orthostatic blood pressure every shift.  Check TSH.  Essential hypertension.  Patient is on amlodipine, Lasix, losartan, metoprolol, valsartan HCTZ as outpatient will hold.  Hold diuretics.  Currently orthostatic hypotension.  Continue IV hydration.  Hyperlipidemia.  Hold Lipitor for now.  Right eye blindness status post prosthesis.  Asthma.  Resume  albuterol inhaler, Symbicort from home.  Currently compensated.  History of lupus.  Patient is on methotrexate Valtrex and Plaquenil outpatient.  On multiple skin cream for lupus dermatitis.  Currently medication reconciliation pending.  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 She has never used smokeless tobacco. She reports current alcohol use. She reports that she does not use drugs.  Allergies  Allergen Reactions   Erythromycin Nausea And Vomiting and Shortness Of Breath   Penicillins Anaphylaxis   Sulfa Antibiotics Anaphylaxis and Nausea And Vomiting   Morphine Other (See Comments)    insomnia   Tetracyclines & Related Nausea And Vomiting   Morphine And Related Other (See Comments)    Stop breathing    No family history on file.   Prior to Admission medications   Medication Sig Start Date End Date Taking? Authorizing Provider  Acetaminophen 500 MG capsule Take by mouth. 08/15/16   [provider]  albuterol (VENTOLIN HFA) 108 (90 Base) MCG/ACT inhaler Inhale into the lungs.    [provider]  alclomethasone (ACLOVATE) 0.05 % cream APPLY TOPICALLY 2 (TWO) TIMES DAILY AS NEEDED (RASH) 12/22/21   Sheffield, Harvin Hazel R, PA-C  amLODipine (NORVASC) 5 MG tablet Take 5 mg by mouth daily. 07/30/20   [provider]  atorvastatin (LIPITOR) 40 MG tablet Take 40 mg by mouth daily.    [provider]  cholecalciferol (VITAMIN D) 1000 units tablet Take by mouth. 07/20/16   [provider]  clobetasol (OLUX) 0.05 % topical foam Apply topically 2 (two) times daily. 02/24/21   Janalyn Harder, MD  Dapsone 7.5 % GEL Apply 1 application. topically at bedtime. 09/29/21   Janalyn Harder, MD  DEXILANT 60 MG capsule Take 1 capsule by mouth daily. 06/03/20   [provider]  DULoxetine (CYMBALTA) 60 MG capsule Take 120 mg by mouth daily. 07/02/20   [provider]  esomeprazole (NEXIUM) 40 MG capsule Take 40 mg by mouth daily before breakfast.    [provider]  famotidine (PEPCID) 20 MG tablet Take 20 mg by mouth 2 (two) times daily. 06/03/20   [provider]  Fluocinolone Acetonide Body (DERMA-SMOOTHE/FS BODY) 0.01 % OIL Apply to affected area qd 06/19/21   Glyn Ade, PA-C  folic acid (FOLVITE) 800 MCG tablet Take by mouth. 09/18/16   [provider]  furosemide (LASIX) 20 MG tablet Take 20 mg by mouth daily. 06/03/20   [provider]  hydroxychloroquine (PLAQUENIL) 200 MG tablet Take by mouth. 09/01/16   [provider]  ibuprofen (ADVIL,MOTRIN)  800 MG tablet Take 800 mg by mouth every 8 (eight) hours as needed.    [provider]  lactulose (CHRONULAC) 10 GM/15ML solution  07/30/20   [provider]  LINZESS 290 MCG CAPS capsule Take 290 mcg by mouth daily. 07/30/20   [provider]  loratadine (CLARITIN) 10 MG tablet Take 10 mg by mouth daily.    [provider]  losartan (COZAAR) 25 MG tablet Take 25 mg by mouth 2 (two) times daily.    [provider]  methotrexate 50 MG/2ML injection Inject into the vein.    [provider]  metoCLOPramide (REGLAN) 10 MG tablet Take 1 tablet (10 mg total) by mouth 3 (three) times daily with meals. 10/03/22 10/03/23  Elson Areas, PA-C  metoCLOPramide (REGLAN) 10 MG tablet Take 1 tablet (10 mg total) by mouth 3 (three) times daily with meals. 10/03/22 10/03/23  Elson Areas, PA-C  metoprolol succinate (TOPROL-XL) 100  MG 24 hr tablet Take 100 mg by mouth daily. Take with or immediately following a meal.    [provider]  Multiple Vitamin (MULTIVITAMIN) capsule Take by mouth. 07/27/16   [provider]  mupirocin ointment (BACTROBAN) 2 % Apply 1 application topically 2 (two) times daily. 06/19/21   Sheffield, Judye Bos, PA-C  omeprazole (PRILOSEC) 20 MG capsule Take 20 mg by mouth daily.    [provider]  oxyCODONE-acetaminophen (PERCOCET/ROXICET) 5-325 MG tablet Take by mouth. 08/02/20   [provider]  polyethylene glycol powder (GLYCOLAX/MIRALAX) powder MIX 17 GRAMS IN LIQUID AND DRINK DAILY AS NEEDED 10/06/16   [provider]  potassium chloride (KLOR-CON) 10 MEQ tablet Take 10 mEq by mouth daily. 06/03/20   [provider]  SYMBICORT 80-4.5 MCG/ACT inhaler Inhale into the lungs. 07/30/20   [provider]  tizanidine (ZANAFLEX) 2 MG capsule Take 1 capsule (2 mg total) by mouth 3 (three) times daily. 02/28/21   Wurst, [place], PA-C  topiramate (TOPAMAX) 25 MG tablet Take 25 mg by mouth daily. 08/02/20   [provider]  traZODone (DESYREL) 150 MG tablet Take by mouth at bedtime.    [provider]  umeclidinium-vilanterol (ANORO ELLIPTA) 62.5-25 MCG/INH AEPB Inhale 1 puff into the lungs daily.    [provider]  valACYclovir (VALTREX) 1000 MG tablet Take 1,000 mg by mouth daily. 07/30/20   [provider]  valsartan-hydrochlorothiazide (DIOVAN-HCT) 320-12.5 MG tablet TAKE 1 TABLET EVERY DAY 12/25/16   [provider]  vitamin B-12 (CYANOCOBALAMIN) 1000 MCG tablet Take by mouth. 09/18/16   [provider]  Vitamin E 400 units TABS Take by mouth. 08/15/16   [provider]    Physical Exam: Vitals:   10/07/22 1700 10/07/22 1730 10/07/22 1800 10/07/22 2017  BP: 93/78 (!) 125/90 (!) 121/90   Pulse: 88  85   Resp: (!) 24 13 16    Temp:    98.5 F (36.9 C)  TempSrc:    Oral  SpO2: 98%  96%     Wt Readings from Last 3 Encounters:  10/03/22 70.3 kg  12/17/20 111.1 kg  10/09/20 111.1 kg   There is no height or weight on file to calculate BMI.  General:  Average built, not in obvious distress, thinly built, HENT: Normocephalic, No scleral pallor or icterus noted. Oral mucosa is dry right eye prosthesis.  Poor dentition. Chest:  Clear breath sounds.  . No crackles or wheezes.  CVS: S1 &S2 heard. No murmur.  Regular rate and  rhythm. Abdomen: Soft,  nondistended.  Nonspecific tenderness on palpation, bowel sounds are heard Extremities: No cyanosis, clubbing or edema.  Peripheral pulses are palpable. Psych: Alert, awake and oriented, normal mood CNS:  No cranial nerve deficits.  Power equal in all extremities.   Skin: Warm and dry.  Decreased skin turgor noted  Labs on Admission:   CBC: Recent Labs  Lab 10/03/22 1646 10/03/22 1950 10/07/22 1312  WBC 8.8  --  7.2  HGB 13.3 13.3 13.7  HCT 39.1 39.0 40.6  MCV 79.8*  --  80.4  PLT 295  --  334    Basic Metabolic Panel: Recent Labs  Lab 10/03/22 1646 10/03/22 1950 10/07/22 1312  NA 135 134* 131*  K 3.2* 4.0 2.8*  CL 97* 98 94*  CO2 20*  --  25  GLUCOSE 94 83 106*  BUN 10 10 7   CREATININE 1.38* 1.20* 1.10*  CALCIUM 9.7  --  9.4  MG 1.7  --   --     Liver Function Tests: Recent Labs  Lab 10/03/22 1646 10/07/22 1312  AST 13* 14*  ALT 6 9  ALKPHOS 59 67  BILITOT 0.6 0.8  PROT 7.5 7.8  ALBUMIN 3.5 3.6   Recent Labs  Lab 10/03/22 1646 10/07/22 1312  LIPASE 33 22   No results for input(s): "AMMONIA" in the last 168 hours.  Cardiac Enzymes: No results for input(s): "CKTOTAL", "CKMB", "CKMBINDEX", "TROPONINI" in the last 168 hours.  BNP (last 3 results) No results for input(s): "BNP" in the last 8760 hours.  ProBNP (last 3 results) No results for input(s): "PROBNP" in the last 8760 hours.  CBG: Recent Labs  Lab 10/03/22 1647  GLUCAP 105*    Lipase     Component Value Date/Time   LIPASE  22 10/07/2022 1312     Urinalysis    Component Value Date/Time   COLORURINE AMBER (A) 10/07/2022 1312   APPEARANCEUR HAZY (A) 10/07/2022 1312   LABSPEC 1.030 10/07/2022 1312   PHURINE 5.0 10/07/2022 1312   GLUCOSEU NEGATIVE 10/07/2022 1312   HGBUR NEGATIVE 10/07/2022 1312   BILIRUBINUR MODERATE (A) 10/07/2022 1312   KETONESUR 20 (A) 10/07/2022 1312   PROTEINUR 100 (A) 10/07/2022 1312   UROBILINOGEN 1.0 11/04/2007 0833   NITRITE NEGATIVE 10/07/2022 1312   LEUKOCYTESUR NEGATIVE 10/07/2022 1312     Drugs of Abuse  No results found for: "LABOPIA", "COCAINSCRNUR", "LABBENZ", "AMPHETMU", "THCU", "LABBARB"    Radiological Exams on Admission: DG Abdomen 1 View  Result Date: 10/07/2022 CLINICAL DATA:  Pain.  Emesis EXAM: ABDOMEN - 1 VIEW COMPARISON:  None Available. FINDINGS: Battery pack along the upper right hemipelvis with a stimulator lead along the right hemipelvis. Scattered surgical clips. Scattered degenerative changes along the spine. Gas seen along nondilated loops of small and large bowel. Scattered stool. No obstruction. Gas and stool along the rectum. No obvious free air on this supine radiograph. IMPRESSION: Nonspecific bowel gas pattern with some scattered gas and stool. Surgical changes.  Sacral stimulator. Electronically Signed   By: Karen Kays M.D.   On: 10/07/2022 17:15    EKG: Personally reviewed by me which shows normal sinus rhythm   Consultant: None  Code Status: Full code  Microbiology none  Antibiotics: None  Family Communication:  Patients' condition and plan of care including tests being ordered have been discussed with the patient and the patient's wife at bedside who indicate understanding and agree with the plan.   Status is: Observation The patient remains  OBS appropriate and will d/c before 2 midnights.   Severity of Illness: The appropriate patient status for this patient is OBSERVATION. Observation status is judged to be reasonable and  necessary in order to provide the required intensity of service to ensure the patient's safety. The patient's presenting symptoms, physical exam findings, and initial radiographic and laboratory data in the context of their medical condition is felt to place them at decreased risk for further clinical deterioration. Furthermore, it is anticipated that the patient will be medically stable for discharge from the hospital within 2 midnights of admission.   Signed, Joycelyn Das, MD [facility] 10/07/2022

## 2022-10-07 NOTE — ED Provider Triage Note (Signed)
Emergency Medicine Provider Triage Evaluation Note  Mary Wilson , a 57 y.o. female  was evaluated in triage.  Pt complains of N/V. Seen previous for same. Taking home meds without relief. Some diarrhea. No fever. Seen by PCP sent here. No fever, UTI sx. No suspicion food intake, travel  Review of Systems  Positive: N/V Negative: fever  Physical Exam  BP 97/75 (BP Location: Right Arm)   Pulse (!) 105   Temp 98.3 F (36.8 C) (Oral)   Resp 18   SpO2 100%  Gen:   Awake, no distress   Resp:  Normal effort  MSK:   Moves extremities without difficulty  Other:    Medical Decision Making  Medically screening exam initiated at 3:11 PM.  Appropriate orders placed.  Mary Wilson was informed that the remainder of the evaluation will be completed by another provider, this initial triage assessment does not replace that evaluation, and the importance of remaining in the ED until their evaluation is complete.  N/V   Media Pizzini A, PA-C 10/07/22 1512

## 2022-10-08 DIAGNOSIS — Z8571 Personal history of Hodgkin lymphoma: Secondary | ICD-10-CM | POA: Diagnosis not present

## 2022-10-08 DIAGNOSIS — Z79899 Other long term (current) drug therapy: Secondary | ICD-10-CM | POA: Diagnosis not present

## 2022-10-08 DIAGNOSIS — H548 Legal blindness, as defined in USA: Secondary | ICD-10-CM | POA: Diagnosis present

## 2022-10-08 DIAGNOSIS — I951 Orthostatic hypotension: Secondary | ICD-10-CM | POA: Diagnosis present

## 2022-10-08 DIAGNOSIS — L930 Discoid lupus erythematosus: Secondary | ICD-10-CM | POA: Diagnosis present

## 2022-10-08 DIAGNOSIS — Z853 Personal history of malignant neoplasm of breast: Secondary | ICD-10-CM | POA: Diagnosis not present

## 2022-10-08 DIAGNOSIS — Z7951 Long term (current) use of inhaled steroids: Secondary | ICD-10-CM | POA: Diagnosis not present

## 2022-10-08 DIAGNOSIS — E785 Hyperlipidemia, unspecified: Secondary | ICD-10-CM | POA: Diagnosis present

## 2022-10-08 DIAGNOSIS — E440 Moderate protein-calorie malnutrition: Secondary | ICD-10-CM | POA: Diagnosis present

## 2022-10-08 DIAGNOSIS — E871 Hypo-osmolality and hyponatremia: Secondary | ICD-10-CM | POA: Diagnosis present

## 2022-10-08 DIAGNOSIS — K3184 Gastroparesis: Secondary | ICD-10-CM | POA: Diagnosis present

## 2022-10-08 DIAGNOSIS — I69354 Hemiplegia and hemiparesis following cerebral infarction affecting left non-dominant side: Secondary | ICD-10-CM | POA: Diagnosis not present

## 2022-10-08 DIAGNOSIS — E1143 Type 2 diabetes mellitus with diabetic autonomic (poly)neuropathy: Secondary | ICD-10-CM | POA: Diagnosis present

## 2022-10-08 DIAGNOSIS — K581 Irritable bowel syndrome with constipation: Secondary | ICD-10-CM | POA: Diagnosis present

## 2022-10-08 DIAGNOSIS — Z6841 Body Mass Index (BMI) 40.0 and over, adult: Secondary | ICD-10-CM | POA: Diagnosis not present

## 2022-10-08 DIAGNOSIS — J45909 Unspecified asthma, uncomplicated: Secondary | ICD-10-CM | POA: Diagnosis present

## 2022-10-08 DIAGNOSIS — Z79631 Long term (current) use of antimetabolite agent: Secondary | ICD-10-CM | POA: Diagnosis not present

## 2022-10-08 DIAGNOSIS — E869 Volume depletion, unspecified: Secondary | ICD-10-CM | POA: Diagnosis present

## 2022-10-08 DIAGNOSIS — I10 Essential (primary) hypertension: Secondary | ICD-10-CM | POA: Diagnosis present

## 2022-10-08 DIAGNOSIS — E876 Hypokalemia: Secondary | ICD-10-CM | POA: Diagnosis present

## 2022-10-08 DIAGNOSIS — Z87891 Personal history of nicotine dependence: Secondary | ICD-10-CM | POA: Diagnosis not present

## 2022-10-08 DIAGNOSIS — Z90710 Acquired absence of both cervix and uterus: Secondary | ICD-10-CM | POA: Diagnosis not present

## 2022-10-08 DIAGNOSIS — L308 Other specified dermatitis: Secondary | ICD-10-CM | POA: Diagnosis present

## 2022-10-08 DIAGNOSIS — Z7985 Long-term (current) use of injectable non-insulin antidiabetic drugs: Secondary | ICD-10-CM | POA: Diagnosis not present

## 2022-10-08 LAB — MAGNESIUM: Magnesium: 1.7 mg/dL (ref 1.7–2.4)

## 2022-10-08 LAB — COMPREHENSIVE METABOLIC PANEL
ALT: 8 U/L (ref 0–44)
AST: 10 U/L — ABNORMAL LOW (ref 15–41)
Albumin: 2.8 g/dL — ABNORMAL LOW (ref 3.5–5.0)
Alkaline Phosphatase: 48 U/L (ref 38–126)
Anion gap: 11 (ref 5–15)
BUN: 7 mg/dL (ref 6–20)
CO2: 22 mmol/L (ref 22–32)
Calcium: 8.6 mg/dL — ABNORMAL LOW (ref 8.9–10.3)
Chloride: 102 mmol/L (ref 98–111)
Creatinine, Ser: 0.96 mg/dL (ref 0.44–1.00)
GFR, Estimated: >60 mL/min (ref >60)
Glucose, Bld: 67 mg/dL — ABNORMAL LOW (ref 70–99)
Potassium: 3 mmol/L — ABNORMAL LOW (ref 3.5–5.1)
Sodium: 135 mmol/L (ref 135–145)
Total Bilirubin: 1 mg/dL (ref 0.3–1.2)
Total Protein: 6 g/dL — ABNORMAL LOW (ref 6.5–8.1)

## 2022-10-08 LAB — CBC
HCT: 34.2 % — ABNORMAL LOW (ref 36.0–46.0)
Hemoglobin: 11.5 g/dL — ABNORMAL LOW (ref 12.0–15.0)
MCH: 27.1 pg (ref 26.0–34.0)
MCHC: 33.6 g/dL (ref 30.0–36.0)
MCV: 80.5 fL (ref 80.0–100.0)
Platelets: 246 10*3/uL (ref 150–400)
RBC: 4.25 MIL/uL (ref 3.87–5.11)
RDW: 16.2 % — ABNORMAL HIGH (ref 11.5–15.5)
WBC: 8.1 10*3/uL (ref 4.0–10.5)
nRBC: 0 % (ref 0.0–0.2)

## 2022-10-08 LAB — GLUCOSE, CAPILLARY
Glucose-Capillary: 64 mg/dL — ABNORMAL LOW (ref 70–99)
Glucose-Capillary: 75 mg/dL (ref 70–99)
Glucose-Capillary: 66 mg/dL — ABNORMAL LOW (ref 70–99)
Glucose-Capillary: 75 mg/dL (ref 70–99)
Glucose-Capillary: 87 mg/dL (ref 70–99)
Glucose-Capillary: 79 mg/dL (ref 70–99)

## 2022-10-08 LAB — HIV ANTIBODY (ROUTINE TESTING W REFLEX): HIV Screen 4th Generation wRfx: NONREACTIVE

## 2022-10-08 LAB — TSH: TSH: 1.171 u[IU]/mL (ref 0.350–4.500)

## 2022-10-08 MED ORDER — TRAZODONE HCL 100 MG PO TABS
300.0000 mg | ORAL_TABLET | Freq: Every day | ORAL | Status: AC
  Administered 2022-10-08: 300 mg via ORAL
  Filled 2022-10-08: qty 3

## 2022-10-08 MED ORDER — NICOTINE 14 MG/24HR TD PT24
14.0000 mg | MEDICATED_PATCH | Freq: Every day | TRANSDERMAL | Status: DC
  Administered 2022-10-08 – 2022-10-12 (×5): 14 mg via TRANSDERMAL
  Filled 2022-10-08 (×5): qty 1

## 2022-10-08 MED ORDER — BISACODYL 5 MG PO TBEC
5.0000 mg | DELAYED_RELEASE_TABLET | Freq: Every day | ORAL | Status: AC | PRN
  Administered 2022-10-08: 5 mg via ORAL
  Filled 2022-10-08: qty 1

## 2022-10-08 MED ORDER — POTASSIUM CHLORIDE CRYS ER 20 MEQ PO TBCR
40.0000 meq | EXTENDED_RELEASE_TABLET | Freq: Two times a day (BID) | ORAL | Status: AC
  Administered 2022-10-08 (×2): 40 meq via ORAL
  Filled 2022-10-08 (×2): qty 2

## 2022-10-08 MED ORDER — TRAZODONE HCL 100 MG PO TABS
300.0000 mg | ORAL_TABLET | Freq: Every day | ORAL | Status: DC
  Administered 2022-10-09 – 2022-10-11 (×3): 300 mg via ORAL
  Filled 2022-10-08 (×3): qty 3

## 2022-10-08 MED ORDER — BISACODYL 10 MG RE SUPP: 10.0000 mg | Freq: Every day | RECTAL | Status: DC | PRN

## 2022-10-08 NOTE — Plan of Care (Signed)

## 2022-10-08 NOTE — Progress Notes (Signed)
PROGRESS NOTE  Mary Wilson [place] WUJ:[id number] DOB: 1965/11/26 DOA: 10/07/2022 PCP: [name], NP  HPI/Recap of past 55 hours: 57 years old female with past medical history of gastroparesis, IBS with constipation, history of previous breast cancer, lymphoma, lupus, legal blindness, asthma, CVA and hypertension who presented to hospital with complaints of persistent nausea, vomiting and diarrhea X about 2 weeks. Patient was seen in the ED few days back when she was prescribed Reglan but despite that she was unable to keep anything down.  Patient states that she has not had the much to eat over the last 1 week or so.  Has intermittent vomiting from gastroparesis.  Has been having dizziness on standing positions for the last 2 weeks with an near syncopal episode. Pt was seen at [facility] and was noted to have hypotension and orthostatic.  Patient denied any recent use of antibiotics or recent travels. No concern for food poisoning. In the ED, patient was afebrile but had significant orthostatic hypotension. Labs showed potassium low at 2.8.  Patient admitted for further management.    Today, patient still reports some dizziness upon standing, generalized weakness, some nausea but denies further vomiting.  No further diarrhea noted since yesterday.     Assessment/Plan: Principal Problem:   Orthostatic hypotension Active Problems:   History of breast cancer   Hyperlipidemia   History of Hodgkin's disease   Essential hypertension   BMI 40.0-44.9, adult   Vomiting   Intractable nausea vomiting, diarrhea ??History of gastroparesis Improving GI pathogen and C. difficile panel yet to be collected  Continue IV fluids Continue Zofran Started on clears as tolerated Continue PPI  Orthostatic hypotension Noted to be positive Likely secondary to volume depletion from n/v with multiple antihypertensives at home Continue IV fluid Monitor orthostatic blood pressure  daily  Hypokalemia Replace prn  History of diabetes mellitus type 2 Last hemoglobin A1c of 6.5 as per the patient Repeat A1c  Continue SSI, Accu-Cheks, hypoglycemic protocol  Essential hypertension Patient is on amlodipine, Lasix, losartan, metoprolol, valsartan HCTZ as outpatient will hold all bp meds for now  Hyperlipidemia Hold Lipitor for now.   Right eye blindness status post prosthesis   Asthma Stable Resume inhaler   History of lupus Patient is on methotrexate, Plaquenil outpatient On multiple skin cream for lupus dermatitis     Estimated body mass index is 24.28 kg/m as calculated from the following:   Height as of 10/03/22:  (1.702 m).   Weight as of 10/03/22: 70.3 kg.     Code Status: Full  Family Communication: None at bedside  Disposition Plan: Status is: Observation The patient will require care spanning > 2 midnights and should be moved to inpatient because: Level of care      Consultants: None  Procedures: None  Antimicrobials: None  DVT prophylaxis: Lovenox   Objective: Vitals:   10/08/22 0456 10/08/22 0845 10/08/22 0918 10/08/22 1132  BP: 121/85 114/88  123/85  Pulse: 90 94  99  Resp: 18 16    Temp: 98.3 F (36.8 C) 98.2 F (36.8 C)    TempSrc: Oral     SpO2: 98% 99% 98%     Intake/Output Summary (Last 24 hours) at 10/08/2022 1319 Last data filed at 10/08/2022 1030 Gross per 24 hour  Intake 1104.63 ml  Output 0 ml  Net 1104.63 ml   There were no vitals filed for this visit.  Exam: General: NAD  Cardiovascular: S1, S2 present Respiratory: CTAB Abdomen: Soft,  nontender, nondistended, bowel sounds present Musculoskeletal: No bilateral pedal edema noted Skin: Normal Psychiatry: Normal mood     Data Reviewed: CBC: Recent Labs  Lab 10/03/22 1646 10/03/22 1950 10/07/22 1312 10/08/22 0059  WBC 8.8  --  7.2 8.1  HGB 13.3 13.3 13.7 11.5*  HCT 39.1 39.0 40.6 34.2*  MCV 79.8*  --  80.4 80.5  PLT 295  --  334  246   Basic Metabolic Panel: Recent Labs  Lab 10/03/22 1646 10/03/22 1950 10/07/22 1312 10/08/22 0059  NA 135 134* 131* 135  K 3.2* 4.0 2.8* 3.0*  CL 97* 98 94* 102  CO2 20*  --  25 22  GLUCOSE 94 83 106* 67*  BUN 10 10 7 7   CREATININE 1.38* 1.20* 1.10* 0.96  CALCIUM 9.7  --  9.4 8.6*  MG 1.7  --   --  1.7   GFR: Estimated Creatinine Clearance: 63.6 mL/min (by C-G formula based on SCr of 0.96 mg/dL). Liver Function Tests: Recent Labs  Lab 10/03/22 1646 10/07/22 1312 10/08/22 0059  AST 13* 14* 10*  ALT 6 9 8   ALKPHOS 59 67 48  BILITOT 0.6 0.8 1.0  PROT 7.5 7.8 6.0*  ALBUMIN 3.5 3.6 2.8*   Recent Labs  Lab 10/03/22 1646 10/07/22 1312  LIPASE 33 22   No results for input(s): "AMMONIA" in the last 168 hours. Coagulation Profile: No results for input(s): "INR", "PROTIME" in the last 168 hours. Cardiac Enzymes: No results for input(s): "CKTOTAL", "CKMB", "CKMBINDEX", "TROPONINI" in the last 168 hours. BNP (last 3 results) No results for input(s): "PROBNP" in the last 8760 hours. HbA1C: No results for input(s): "HGBA1C" in the last 72 hours. CBG: Recent Labs  Lab 10/03/22 1647 10/08/22 0734 10/08/22 0801 10/08/22 1124 10/08/22 1147  GLUCAP 105* 64* 75 66* 75   Lipid Profile: No results for input(s): "CHOL", "HDL", "LDLCALC", "TRIG", "CHOLHDL", "LDLDIRECT" in the last 72 hours. Thyroid Function Tests: Recent Labs    10/08/22 0058  TSH 1.171   Anemia Panel: No results for input(s): "VITAMINB12", "FOLATE", "FERRITIN", "TIBC", "IRON", "RETICCTPCT" in the last 72 hours. Urine analysis:    Component Value Date/Time   COLORURINE AMBER (A) 10/07/2022 1312   APPEARANCEUR HAZY (A) 10/07/2022 1312   LABSPEC 1.030 10/07/2022 1312   PHURINE 5.0 10/07/2022 1312   GLUCOSEU NEGATIVE 10/07/2022 1312   HGBUR NEGATIVE 10/07/2022 1312   BILIRUBINUR MODERATE (A) 10/07/2022 1312   KETONESUR 20 (A) 10/07/2022 1312   PROTEINUR 100 (A) 10/07/2022 1312   UROBILINOGEN  1.0 11/04/2007 0833   NITRITE NEGATIVE 10/07/2022 1312   LEUKOCYTESUR NEGATIVE 10/07/2022 1312   Sepsis Labs: @LABRCNTIP (procalcitonin:4,lacticidven:4)  )No results found for this or any previous visit (from the past 240 hour(s)).    Studies: DG Abdomen 1 View  Result Date: 10/07/2022 CLINICAL DATA:  Pain.  Emesis EXAM: ABDOMEN - 1 VIEW COMPARISON:  None Available. FINDINGS: Battery pack along the upper right hemipelvis with a stimulator lead along the right hemipelvis. Scattered surgical clips. Scattered degenerative changes along the spine. Gas seen along nondilated loops of small and large bowel. Scattered stool. No obstruction. Gas and stool along the rectum. No obvious free air on this supine radiograph. IMPRESSION: Nonspecific bowel gas pattern with some scattered gas and stool. Surgical changes.  Sacral stimulator. Electronically Signed   By: Karen Kays M.D.   On: 10/07/2022 17:15    Scheduled Meds:  amitriptyline  150 mg Oral QHS   enoxaparin (LOVENOX) injection  40 mg Subcutaneous  Q24H   insulin aspart  0-9 Units Subcutaneous TID WC   metoprolol succinate  100 mg Oral Daily   mometasone-formoterol  2 puff Inhalation BID   pantoprazole (PROTONIX) IV  40 mg Intravenous Q12H   potassium chloride  40 mEq Oral BID   tiZANidine  2 mg Oral TID   topiramate  50 mg Oral Daily   traZODone  150 mg Oral BID   umeclidinium-vilanterol  1 puff Inhalation Daily    Continuous Infusions:  sodium chloride 0.45 % 1,000 mL with potassium chloride 40 mEq infusion 100 mL/hr at 10/08/22 0905     LOS: 0 days     Briant Cedar, MD [facility]  If 7PM-7AM, please contact night-coverage [email or web address] 10/08/2022, 1:19 PM

## 2022-10-08 NOTE — Progress Notes (Signed)
  Transition of Care [facility]) Screening Note   Patient Details  Name: Mary Wilson Date of Birth: 10-Jun-1966   Transition of Care [facility]) CM/SW Contact:    Otelia Santee, LCSW Phone Number: 10/08/2022, 9:17 AM    Transition of Care Department [facility]) has reviewed patient and no TOC needs have been identified at this time. We will continue to monitor patient advancement through interdisciplinary progression rounds. If new patient transition needs arise, please place a TOC consult.

## 2022-10-09 DIAGNOSIS — E440 Moderate protein-calorie malnutrition: Secondary | ICD-10-CM | POA: Insufficient documentation

## 2022-10-09 LAB — CBC WITH DIFFERENTIAL/PLATELET
Abs Immature Granulocytes: 0.01 10*3/uL (ref 0.00–0.07)
Basophils Absolute: 0 10*3/uL (ref 0.0–0.1)
Basophils Relative: 1 %
Eosinophils Absolute: 0.1 10*3/uL (ref 0.0–0.5)
Eosinophils Relative: 1 %
HCT: 32.3 % — ABNORMAL LOW (ref 36.0–46.0)
Hemoglobin: 10.8 g/dL — ABNORMAL LOW (ref 12.0–15.0)
Immature Granulocytes: 0 %
Lymphocytes Relative: 46 %
Lymphs Abs: 2.7 10*3/uL (ref 0.7–4.0)
MCH: 26.9 pg (ref 26.0–34.0)
MCHC: 33.4 g/dL (ref 30.0–36.0)
MCV: 80.3 fL (ref 80.0–100.0)
Monocytes Absolute: 0.6 10*3/uL (ref 0.1–1.0)
Monocytes Relative: 10 %
Neutro Abs: 2.5 10*3/uL (ref 1.7–7.7)
Neutrophils Relative %: 42 %
Platelets: 245 10*3/uL (ref 150–400)
RBC: 4.02 MIL/uL (ref 3.87–5.11)
RDW: 16.1 % — ABNORMAL HIGH (ref 11.5–15.5)
WBC: 5.9 10*3/uL (ref 4.0–10.5)
nRBC: 0 % (ref 0.0–0.2)

## 2022-10-09 LAB — HEMOGLOBIN A1C
Hgb A1c MFr Bld: 5.1 % (ref 4.8–5.6)
Mean Plasma Glucose: 100 mg/dL

## 2022-10-09 LAB — GLUCOSE, CAPILLARY
Glucose-Capillary: 92 mg/dL (ref 70–99)
Glucose-Capillary: 85 mg/dL (ref 70–99)
Glucose-Capillary: 82 mg/dL (ref 70–99)
Glucose-Capillary: 93 mg/dL (ref 70–99)
Glucose-Capillary: 91 mg/dL (ref 70–99)

## 2022-10-09 LAB — BASIC METABOLIC PANEL
Anion gap: 8 (ref 5–15)
BUN: <5 mg/dL — ABNORMAL LOW (ref 6–20)
CO2: 21 mmol/L — ABNORMAL LOW (ref 22–32)
Calcium: 8.7 mg/dL — ABNORMAL LOW (ref 8.9–10.3)
Chloride: 105 mmol/L (ref 98–111)
Creatinine, Ser: 0.92 mg/dL (ref 0.44–1.00)
GFR, Estimated: >60 mL/min (ref >60)
Glucose, Bld: 90 mg/dL (ref 70–99)
Potassium: 4.4 mmol/L (ref 3.5–5.1)
Sodium: 134 mmol/L — ABNORMAL LOW (ref 135–145)

## 2022-10-09 MED ORDER — VALACYCLOVIR HCL 500 MG PO TABS
1000.0000 mg | ORAL_TABLET | Freq: Every day | ORAL | Status: DC
  Administered 2022-10-09 – 2022-10-12 (×4): 1000 mg via ORAL
  Filled 2022-10-09 (×4): qty 2

## 2022-10-09 MED ORDER — HYDROXYCHLOROQUINE SULFATE 200 MG PO TABS
200.0000 mg | ORAL_TABLET | Freq: Every day | ORAL | Status: DC
  Administered 2022-10-10 – 2022-10-12 (×3): 200 mg via ORAL
  Filled 2022-10-09 (×4): qty 1

## 2022-10-09 MED ORDER — LORATADINE 10 MG PO TABS
10.0000 mg | ORAL_TABLET | Freq: Every day | ORAL | Status: DC
  Administered 2022-10-09 – 2022-10-12 (×4): 10 mg via ORAL
  Filled 2022-10-09 (×4): qty 1

## 2022-10-09 MED ORDER — FOLIC ACID 1 MG PO TABS
1.0000 mg | ORAL_TABLET | Freq: Every day | ORAL | Status: DC
  Administered 2022-10-09 – 2022-10-12 (×4): 1 mg via ORAL
  Filled 2022-10-09 (×4): qty 1

## 2022-10-09 MED ORDER — SODIUM CHLORIDE 0.9 % IV SOLN: INTRAVENOUS | Status: DC

## 2022-10-09 MED ORDER — ENSURE ENLIVE PO LIQD
237.0000 mL | Freq: Two times a day (BID) | ORAL | Status: DC
  Administered 2022-10-09 – 2022-10-12 (×7): 237 mL via ORAL

## 2022-10-09 MED ORDER — ADULT MULTIVITAMIN W/MINERALS CH
1.0000 | ORAL_TABLET | Freq: Every day | ORAL | Status: DC
  Administered 2022-10-09 – 2022-10-12 (×4): 1 via ORAL
  Filled 2022-10-09 (×4): qty 1

## 2022-10-09 MED ORDER — VITAMIN E 45 MG (100 UNIT) PO CAPS
400.0000 [IU] | ORAL_CAPSULE | Freq: Every day | ORAL | Status: DC
  Administered 2022-10-09 – 2022-10-12 (×4): 400 [IU] via ORAL
  Filled 2022-10-09 (×4): qty 4

## 2022-10-09 MED ORDER — FOLIC ACID 800 MCG PO TABS: 800.0000 ug | ORAL_TABLET | Freq: Every day | ORAL | Status: DC

## 2022-10-09 MED ORDER — VITAMIN B-12 1000 MCG PO TABS
1000.0000 ug | ORAL_TABLET | Freq: Every day | ORAL | Status: DC
  Administered 2022-10-09 – 2022-10-10 (×2): 1000 ug via ORAL
  Filled 2022-10-09 (×2): qty 1

## 2022-10-09 NOTE — Progress Notes (Signed)
Initial Nutrition Assessment  DOCUMENTATION CODES:   Non-severe (moderate) malnutrition in context of acute illness/injury  INTERVENTION:   -Ensure Plus High Protein po BID, each supplement provides 350 kcal and 20 grams of protein.   -Multivitamin with minerals daily  -Placed "High Calorie, High Protein" handout in AVS  -Needs updated weight  NUTRITION DIAGNOSIS:   Moderate Malnutrition related to acute illness, nausea, vomiting, diarrhea as evidenced by energy intake < or equal to 75% for > or equal to 1 month, mild fat depletion, mild muscle depletion.  GOAL:   Patient will meet greater than or equal to 90% of their needs  MONITOR:   PO intake, Supplement acceptance, Labs, Weight trends, I & O's  REASON FOR ASSESSMENT:   Malnutrition Screening Tool    ASSESSMENT:   57 years old female with past medical history of gastroparesis, IBS with constipation, history of previous breast cancer, lymphoma, lupus, legal blindness, asthma, CVA and hypertension who presented to hospital with complaints of persistent nausea, vomiting and diarrhea X about 2 weeks.  Patient reports poor appetite given N/V and diarrhea over the past few weeks. States the last time she ate a solid meal and tolerated was around 2-3 weeks she ate a salad with chopped [place]. Pt has been subsisting mainly on applesauce, yogurts, and jello. Has tried Boost as well for additional protein. Was agreeable to receiving vanilla protein shakes once diet was advanced. Will place High Calorie High Protein handout in AVS to help with weight gain.   Last documented weight from 4/20: 154 lbs Needs admission weight. Per patient, she used to weigh ~253 lbs and lost down to 151 lbs.  Medications reviewed.  Labs reviewed: CBGs: 66-92 Low Na  NUTRITION - FOCUSED PHYSICAL EXAM:  Flowsheet Row Most Recent Value  Orbital Region No depletion  Upper Arm Region Mild depletion  Thoracic and Lumbar Region No depletion   Buccal Region Mild depletion  Temple Region Mild depletion  Clavicle Bone Region No depletion  Clavicle and Acromion Bone Region No depletion  Scapular Bone Region No depletion  Dorsal Hand Mild depletion  Patellar Region Mild depletion  Anterior Thigh Region Mild depletion  Posterior Calf Region Mild depletion  Edema (RD Assessment) None  Hair Reviewed  Eyes Reviewed  [right eye prosthesis]  Mouth Reviewed  [poor dentition]  Skin Reviewed  Nails Reviewed       Diet Order:   Diet Order             Diet Carb Modified Fluid consistency: Thin; Room service appropriate? Yes  Diet effective now                   EDUCATION NEEDS:   Education needs have been addressed  Skin:  Skin Assessment: Reviewed RN Assessment  Last BM:  4/24  Height:   Ht Readings from Last 1 Encounters:  10/03/22 5\' 7"  (1.702 m)    Weight:   Wt Readings from Last 1 Encounters:  10/03/22 70.3 kg    BMI:  There is no height or weight on file to calculate BMI.  Estimated Nutritional Needs:   Kcal:  1800-2000  Protein:  85-95g  Fluid:  2L/day   Tilda Franco, MS, RD, LDN Inpatient Clinical Dietitian Contact information available via Amion

## 2022-10-09 NOTE — Progress Notes (Signed)
PROGRESS NOTE  Mary Wilson [place] ZOX:[id number] DOB: 26-Dec-1965 DOA: 10/07/2022 PCP: [name], NP  HPI/Recap of past 49 hours: 57 years old female with past medical history of gastroparesis, IBS with constipation, history of previous breast cancer, lymphoma, lupus, legal blindness, asthma, CVA and hypertension who presented to hospital with complaints of persistent nausea, vomiting and diarrhea X about 2 weeks. Patient was seen in the ED few days back when she was prescribed Reglan but despite that she was unable to keep anything down.  Patient states that she has not had the much to eat over the last 1 week or so.  Has hx of intermittent vomiting from gastroparesis.  Has been having dizziness on standing positions for the last 2 weeks with an near syncopal episode. Pt was seen at [facility] and was noted to have hypotension and orthostatic.  Patient denied any recent use of antibiotics or recent travels. No concern for food poisoning. In the ED, patient was afebrile but had significant orthostatic hypotension. Labs showed potassium low at 2.8.  Patient admitted for further management.    Today, patient denies any further nausea, diarrhea has since resolved. Wants diet to be advanced.  Still reporting dizziness upon standing/with ambulation.    Assessment/Plan: Principal Problem:   Orthostatic hypotension Active Problems:   History of breast cancer   Hyperlipidemia   History of Hodgkin's disease   Essential hypertension   BMI 40.0-44.9, adult (HCC)   Vomiting   Malnutrition of moderate degree   Intractable nausea vomiting, diarrhea ??History of gastroparesis Improving GI pathogen and C. difficile panel not collected  Continue IV fluids Continue Zofran Started on clears as tolerated Continue PPI  Orthostatic hypotension Noted to be positive Likely secondary to volume depletion from n/v with multiple antihypertensives at home Continue IV fluid Monitor  orthostatic blood pressure daily  Hypokalemia Replace prn  History of diabetes mellitus type 2 Last hemoglobin A1c 5.1 Continue SSI, Accu-Cheks, hypoglycemic protocol  Essential hypertension Held home amlodipine, Lasix, losartan, valsartan HCTZ for now Continue metoprolol  Hyperlipidemia Hold Lipitor for now.   Right eye blindness status post prosthesis   Asthma Stable Resume inhaler   History of lupus Patient is on methotrexate, Plaquenil outpatient On multiple skin cream for lupus dermatitis     Estimated body mass index is 24.28 kg/m as calculated from the following:   Height as of 10/03/22: 5\' 7"  (1.702 m).   Weight as of 10/03/22: 70.3 kg.     Code Status: Full  Family Communication: None at bedside  Disposition Plan: Status is: Inpatient The patient will require care spanning > 2 midnights and should be moved to inpatient because: Level of care      Consultants: None  Procedures: None  Antimicrobials: None  DVT prophylaxis: Lovenox   Objective: Vitals:   10/09/22 0605 10/09/22 0919 10/09/22 1209 10/09/22 1428  BP: (!) 128/93  (!) 137/93 132/64  Pulse: 87  79 (!) 108  Resp: 18   14  Temp: 98.1 F (36.7 C)   98.2 F (36.8 C)  TempSrc: Oral     SpO2: 98% 99%  92%    Intake/Output Summary (Last 24 hours) at 10/09/2022 1620 Last data filed at 10/08/2022 2208 Gross per 24 hour  Intake 240 ml  Output --  Net 240 ml   There were no vitals filed for this visit.  Exam: General: NAD  Cardiovascular: S1, S2 present Respiratory: CTAB Abdomen: Soft, nontender, nondistended, bowel sounds present Musculoskeletal: No bilateral  pedal edema noted Skin: Normal Psychiatry: Normal mood     Data Reviewed: CBC: Recent Labs  Lab 10/03/22 1646 10/03/22 1950 10/07/22 1312 10/08/22 0059 10/09/22 0543  WBC 8.8  --  7.2 8.1 5.9  NEUTROABS  --   --   --   --  2.5  HGB 13.3 13.3 13.7 11.5* 10.8*  HCT 39.1 39.0 40.6 34.2* 32.3*  MCV 79.8*  --   80.4 80.5 80.3  PLT 295  --  334 246 245   Basic Metabolic Panel: Recent Labs  Lab 10/03/22 1646 10/03/22 1950 10/07/22 1312 10/08/22 0059 10/09/22 0543  NA 135 134* 131* 135 134*  K 3.2* 4.0 2.8* 3.0* 4.4  CL 97* 98 94* 102 105  CO2 20*  --  25 22 21*  GLUCOSE 94 83 106* 67* 90  BUN 10 10 7 7  <5*  CREATININE 1.38* 1.20* 1.10* 0.96 0.92  CALCIUM 9.7  --  9.4 8.6* 8.7*  MG 1.7  --   --  1.7  --    GFR: Estimated Creatinine Clearance: 66.4 mL/min (by C-G formula based on SCr of 0.92 mg/dL). Liver Function Tests: Recent Labs  Lab 10/03/22 1646 10/07/22 1312 10/08/22 0059  AST 13* 14* 10*  ALT 6 9 8   ALKPHOS 59 67 48  BILITOT 0.6 0.8 1.0  PROT 7.5 7.8 6.0*  ALBUMIN 3.5 3.6 2.8*   Recent Labs  Lab 10/03/22 1646 10/07/22 1312  LIPASE 33 22   No results for input(s): "AMMONIA" in the last 168 hours. Coagulation Profile: No results for input(s): "INR", "PROTIME" in the last 168 hours. Cardiac Enzymes: No results for input(s): "CKTOTAL", "CKMB", "CKMBINDEX", "TROPONINI" in the last 168 hours. BNP (last 3 results) No results for input(s): "PROBNP" in the last 8760 hours. HbA1C: Recent Labs    10/08/22 0058  HGBA1C 5.1   CBG: Recent Labs  Lab 10/08/22 1614 10/08/22 2201 10/09/22 0246 10/09/22 0729 10/09/22 1125  GLUCAP 87 79 92 85 82   Lipid Profile: No results for input(s): "CHOL", "HDL", "LDLCALC", "TRIG", "CHOLHDL", "LDLDIRECT" in the last 72 hours. Thyroid Function Tests: Recent Labs    10/08/22 0058  TSH 1.171   Anemia Panel: No results for input(s): "VITAMINB12", "FOLATE", "FERRITIN", "TIBC", "IRON", "RETICCTPCT" in the last 72 hours. Urine analysis:    Component Value Date/Time   COLORURINE AMBER (A) 10/07/2022 1312   APPEARANCEUR HAZY (A) 10/07/2022 1312   LABSPEC 1.030 10/07/2022 1312   PHURINE 5.0 10/07/2022 1312   GLUCOSEU NEGATIVE 10/07/2022 1312   HGBUR NEGATIVE 10/07/2022 1312   BILIRUBINUR MODERATE (A) 10/07/2022 1312    KETONESUR 20 (A) 10/07/2022 1312   PROTEINUR 100 (A) 10/07/2022 1312   UROBILINOGEN 1.0 11/04/2007 0833   NITRITE NEGATIVE 10/07/2022 1312   LEUKOCYTESUR NEGATIVE 10/07/2022 1312   Sepsis Labs: @LABRCNTIP (procalcitonin:4,lacticidven:4)  )No results found for this or any previous visit (from the past 240 hour(s)).    Studies: No results found.  Scheduled Meds:  amitriptyline  150 mg Oral QHS   enoxaparin (LOVENOX) injection  40 mg Subcutaneous Q24H   feeding supplement  237 mL Oral BID BM   insulin aspart  0-9 Units Subcutaneous TID WC   metoprolol succinate  100 mg Oral Daily   mometasone-formoterol  2 puff Inhalation BID   multivitamin with minerals  1 tablet Oral Daily   nicotine  14 mg Transdermal Daily   pantoprazole (PROTONIX) IV  40 mg Intravenous Q12H   tiZANidine  2 mg Oral TID  topiramate  50 mg Oral Daily   traZODone  300 mg Oral QHS   umeclidinium-vilanterol  1 puff Inhalation Daily    Continuous Infusions:  sodium chloride 0.45 % 1,000 mL with potassium chloride 40 mEq infusion 100 mL/hr at 10/09/22 0610     LOS: 1 day     Briant Cedar, MD [facility]  If 7PM-7AM, please contact night-coverage [email or web address] 10/09/2022, 4:20 PM

## 2022-10-09 NOTE — Discharge Instructions (Signed)

## 2022-10-10 LAB — CBC WITH DIFFERENTIAL/PLATELET
Abs Immature Granulocytes: 0.01 10*3/uL (ref 0.00–0.07)
Basophils Absolute: 0 10*3/uL (ref 0.0–0.1)
Basophils Relative: 0 %
Eosinophils Absolute: 0.1 10*3/uL (ref 0.0–0.5)
Eosinophils Relative: 1 %
HCT: 31.3 % — ABNORMAL LOW (ref 36.0–46.0)
Hemoglobin: 10.6 g/dL — ABNORMAL LOW (ref 12.0–15.0)
Immature Granulocytes: 0 %
Lymphocytes Relative: 45 %
Lymphs Abs: 2.8 10*3/uL (ref 0.7–4.0)
MCH: 27.2 pg (ref 26.0–34.0)
MCHC: 33.9 g/dL (ref 30.0–36.0)
MCV: 80.3 fL (ref 80.0–100.0)
Monocytes Absolute: 0.6 10*3/uL (ref 0.1–1.0)
Monocytes Relative: 10 %
Neutro Abs: 2.9 10*3/uL (ref 1.7–7.7)
Neutrophils Relative %: 44 %
Platelets: 226 10*3/uL (ref 150–400)
RBC: 3.9 MIL/uL (ref 3.87–5.11)
RDW: 16.3 % — ABNORMAL HIGH (ref 11.5–15.5)
WBC: 6.4 10*3/uL (ref 4.0–10.5)
nRBC: 0 % (ref 0.0–0.2)

## 2022-10-10 LAB — GLUCOSE, CAPILLARY
Glucose-Capillary: 84 mg/dL (ref 70–99)
Glucose-Capillary: 100 mg/dL — ABNORMAL HIGH (ref 70–99)
Glucose-Capillary: 95 mg/dL (ref 70–99)
Glucose-Capillary: 101 mg/dL — ABNORMAL HIGH (ref 70–99)

## 2022-10-10 LAB — BASIC METABOLIC PANEL
Anion gap: 8 (ref 5–15)
BUN: <5 mg/dL — ABNORMAL LOW (ref 6–20)
CO2: 22 mmol/L (ref 22–32)
Calcium: 9.1 mg/dL (ref 8.9–10.3)
Chloride: 106 mmol/L (ref 98–111)
Creatinine, Ser: 0.72 mg/dL (ref 0.44–1.00)
GFR, Estimated: >60 mL/min (ref >60)
Glucose, Bld: 79 mg/dL (ref 70–99)
Potassium: 3.8 mmol/L (ref 3.5–5.1)
Sodium: 136 mmol/L (ref 135–145)

## 2022-10-10 MED ORDER — METOCLOPRAMIDE HCL 5 MG/ML IJ SOLN
10.0000 mg | Freq: Three times a day (TID) | INTRAMUSCULAR | Status: AC
  Administered 2022-10-10 – 2022-10-12 (×6): 10 mg via INTRAVENOUS
  Filled 2022-10-10 (×6): qty 2

## 2022-10-10 NOTE — Progress Notes (Addendum)
PROGRESS NOTE  Mary Wilson [place] ZOX:[id number] DOB: 1965-10-17 DOA: 10/07/2022 PCP: Mary Paris, NP  HPI/Recap of past 19 hours: 57 years old female with past medical history of gastroparesis, IBS with constipation, history of previous breast cancer, lymphoma, lupus, legal blindness, asthma, CVA and hypertension who presented to hospital with complaints of persistent nausea, vomiting and diarrhea X about 2 weeks. Patient was seen in the ED few days back when she was prescribed Reglan but despite that she was unable to keep anything down.  Patient states that she has not had the much to eat over the last 1 week or so.  Has hx of intermittent vomiting from gastroparesis.  Has been having dizziness on standing positions for the last 2 weeks with an near syncopal episode. Pt was seen at [facility] and was noted to have hypotension and orthostatic.  Patient denied any recent use of antibiotics or recent travels. No concern for food poisoning. In the ED, patient was afebrile but had significant orthostatic hypotension. Labs showed potassium low at 2.8.  Patient admitted for further management.    Today, patient still reporting early satiety, feeling full after a few bites of meal, still nauseous but denies any vomiting, denies any diarrhea, shortness of breath, chest pain, fever/chills.  Still reports dizziness upon standing.    Assessment/Plan: Principal Problem:   Orthostatic hypotension Active Problems:   History of breast cancer   Hyperlipidemia   History of Hodgkin's disease   Essential hypertension   BMI 40.0-44.9, adult (HCC)   Vomiting   Malnutrition of moderate degree   Intractable nausea vomiting, diarrhea ??History of gastroparesis Improving GI pathogen and C. difficile panel not collected  Continue IV fluids Continue Zofran Start reglan and monitor closely Continue PPI Advance diet  Orthostatic hypotension Noted to be positive Likely secondary to volume  depletion from n/v with multiple antihypertensives at home Continue IV fluid Monitor orthostatic blood pressure daily PT/OT  Hypokalemia Replace prn  History of diabetes mellitus type 2 Last hemoglobin A1c 5.1 Continue SSI, Accu-Cheks, hypoglycemic protocol  Essential hypertension Held home amlodipine, Lasix, losartan, valsartan HCTZ for now Continue metoprolol  Hyperlipidemia Hold Lipitor for now   Right eye blindness status post prosthesis   Asthma Stable Resume inhaler   History of lupus Patient is on methotrexate, Plaquenil outpatient On multiple skin cream for lupus dermatitis     Estimated body mass index is 25.86 kg/m as calculated from the following:   Height as of 10/03/22: 5\' 7"  (1.702 m).   Weight as of this encounter: 74.9 kg.     Code Status: Full  Family Communication: None at bedside  Disposition Plan: Status is: Inpatient The patient will require care spanning > 2 midnights and should be moved to inpatient because: Level of care      Consultants: None  Procedures: None  Antimicrobials: None  DVT prophylaxis: Lovenox   Objective: Vitals:   10/10/22 0500 10/10/22 0838 10/10/22 0840 10/10/22 1352  BP:    (!) 135/92  Pulse:    79  Resp:    18  Temp:    98.1 F (36.7 C)  TempSrc:    Oral  SpO2:  99% 99% 100%  Weight: 74.9 kg       Intake/Output Summary (Last 24 hours) at 10/10/2022 1458 Last data filed at 10/09/2022 2136 Gross per 24 hour  Intake 360 ml  Output --  Net 360 ml   Filed Weights   10/09/22 1654 10/10/22 0500  Weight:  75.5 kg 74.9 kg    Exam: General: NAD  Cardiovascular: S1, S2 present Respiratory: CTAB Abdomen: Soft, nontender, nondistended, bowel sounds present Musculoskeletal: No bilateral pedal edema noted Skin: Normal Psychiatry: Normal mood     Data Reviewed: CBC: Recent Labs  Lab 10/03/22 1646 10/03/22 1950 10/07/22 1312 10/08/22 0059 10/09/22 0543 10/10/22 0657  WBC 8.8  --  7.2 8.1  5.9 6.4  NEUTROABS  --   --   --   --  2.5 2.9  HGB 13.3 13.3 13.7 11.5* 10.8* 10.6*  HCT 39.1 39.0 40.6 34.2* 32.3* 31.3*  MCV 79.8*  --  80.4 80.5 80.3 80.3  PLT 295  --  334 246 245 226   Basic Metabolic Panel: Recent Labs  Lab 10/03/22 1646 10/03/22 1950 10/07/22 1312 10/08/22 0059 10/09/22 0543 10/10/22 0657  NA 135 134* 131* 135 134* 136  K 3.2* 4.0 2.8* 3.0* 4.4 3.8  CL 97* 98 94* 102 105 106  CO2 20*  --  25 22 21* 22  GLUCOSE 94 83 106* 67* 90 79  BUN 10 10 7 7  <5* <5*  CREATININE 1.38* 1.20* 1.10* 0.96 0.92 0.72  CALCIUM 9.7  --  9.4 8.6* 8.7* 9.1  MG 1.7  --   --  1.7  --   --    GFR: Estimated Creatinine Clearance: 82.9 mL/min (by C-G formula based on SCr of 0.72 mg/dL). Liver Function Tests: Recent Labs  Lab 10/03/22 1646 10/07/22 1312 10/08/22 0059  AST 13* 14* 10*  ALT 6 9 8   ALKPHOS 59 67 48  BILITOT 0.6 0.8 1.0  PROT 7.5 7.8 6.0*  ALBUMIN 3.5 3.6 2.8*   Recent Labs  Lab 10/03/22 1646 10/07/22 1312  LIPASE 33 22   No results for input(s): "AMMONIA" in the last 168 hours. Coagulation Profile: No results for input(s): "INR", "PROTIME" in the last 168 hours. Cardiac Enzymes: No results for input(s): "CKTOTAL", "CKMB", "CKMBINDEX", "TROPONINI" in the last 168 hours. BNP (last 3 results) No results for input(s): "PROBNP" in the last 8760 hours. HbA1C: Recent Labs    10/08/22 0058  HGBA1C 5.1   CBG: Recent Labs  Lab 10/09/22 1125 10/09/22 1649 10/09/22 2122 10/10/22 0808 10/10/22 1219  GLUCAP 82 93 91 84 100*   Lipid Profile: No results for input(s): "CHOL", "HDL", "LDLCALC", "TRIG", "CHOLHDL", "LDLDIRECT" in the last 72 hours. Thyroid Function Tests: Recent Labs    10/08/22 0058  TSH 1.171   Anemia Panel: No results for input(s): "VITAMINB12", "FOLATE", "FERRITIN", "TIBC", "IRON", "RETICCTPCT" in the last 72 hours. Urine analysis:    Component Value Date/Time   COLORURINE AMBER (A) 10/07/2022 1312   APPEARANCEUR HAZY (A)  10/07/2022 1312   LABSPEC 1.030 10/07/2022 1312   PHURINE 5.0 10/07/2022 1312   GLUCOSEU NEGATIVE 10/07/2022 1312   HGBUR NEGATIVE 10/07/2022 1312   BILIRUBINUR MODERATE (A) 10/07/2022 1312   KETONESUR 20 (A) 10/07/2022 1312   PROTEINUR 100 (A) 10/07/2022 1312   UROBILINOGEN 1.0 11/04/2007 0833   NITRITE NEGATIVE 10/07/2022 1312   LEUKOCYTESUR NEGATIVE 10/07/2022 1312   Sepsis Labs: @LABRCNTIP (procalcitonin:4,lacticidven:4)  )No results found for this or any previous visit (from the past 240 hour(s)).    Studies: No results found.  Scheduled Meds:  amitriptyline  150 mg Oral QHS   cyanocobalamin  1,000 mcg Oral Daily   enoxaparin (LOVENOX) injection  40 mg Subcutaneous Q24H   feeding supplement  237 mL Oral BID BM   folic acid  1 mg Oral Daily  hydroxychloroquine  200 mg Oral Daily   insulin aspart  0-9 Units Subcutaneous TID WC   loratadine  10 mg Oral Daily   metoprolol succinate  100 mg Oral Daily   mometasone-formoterol  2 puff Inhalation BID   multivitamin with minerals  1 tablet Oral Daily   nicotine  14 mg Transdermal Daily   pantoprazole (PROTONIX) IV  40 mg Intravenous Q12H   tiZANidine  2 mg Oral TID   topiramate  50 mg Oral Daily   traZODone  300 mg Oral QHS   umeclidinium-vilanterol  1 puff Inhalation Daily   valACYclovir  1,000 mg Oral Daily   vitamin E  400 Units Oral Daily    Continuous Infusions:  sodium chloride 100 mL/hr at 10/10/22 1349     LOS: 2 days     Briant Cedar, MD [facility]  If 7PM-7AM, please contact night-coverage [email or web address] 10/10/2022, 2:58 PM

## 2022-10-10 NOTE — Plan of Care (Signed)

## 2022-10-11 ENCOUNTER — Encounter (HOSPITAL_COMMUNITY): Payer: Self-pay | Admitting: Internal Medicine

## 2022-10-11 LAB — CBC WITH DIFFERENTIAL/PLATELET
Abs Immature Granulocytes: 0.01 10*3/uL (ref 0.00–0.07)
Basophils Absolute: 0 10*3/uL (ref 0.0–0.1)
Basophils Relative: 0 %
Eosinophils Absolute: 0.1 10*3/uL (ref 0.0–0.5)
Eosinophils Relative: 1 %
HCT: 32.4 % — ABNORMAL LOW (ref 36.0–46.0)
Hemoglobin: 10.8 g/dL — ABNORMAL LOW (ref 12.0–15.0)
Immature Granulocytes: 0 %
Lymphocytes Relative: 42 %
Lymphs Abs: 3.1 10*3/uL (ref 0.7–4.0)
MCH: 27.1 pg (ref 26.0–34.0)
MCHC: 33.3 g/dL (ref 30.0–36.0)
MCV: 81.2 fL (ref 80.0–100.0)
Monocytes Absolute: 0.8 10*3/uL (ref 0.1–1.0)
Monocytes Relative: 10 %
Neutro Abs: 3.5 10*3/uL (ref 1.7–7.7)
Neutrophils Relative %: 47 %
Platelets: 243 10*3/uL (ref 150–400)
RBC: 3.99 MIL/uL (ref 3.87–5.11)
RDW: 16.4 % — ABNORMAL HIGH (ref 11.5–15.5)
WBC: 7.4 10*3/uL (ref 4.0–10.5)
nRBC: 0 % (ref 0.0–0.2)

## 2022-10-11 LAB — BASIC METABOLIC PANEL
Anion gap: 11 (ref 5–15)
BUN: 6 mg/dL (ref 6–20)
CO2: 21 mmol/L — ABNORMAL LOW (ref 22–32)
Calcium: 8.9 mg/dL (ref 8.9–10.3)
Chloride: 103 mmol/L (ref 98–111)
Creatinine, Ser: 0.95 mg/dL (ref 0.44–1.00)
GFR, Estimated: >60 mL/min (ref >60)
Glucose, Bld: 89 mg/dL (ref 70–99)
Potassium: 3.6 mmol/L (ref 3.5–5.1)
Sodium: 135 mmol/L (ref 135–145)

## 2022-10-11 LAB — GLUCOSE, CAPILLARY
Glucose-Capillary: 95 mg/dL (ref 70–99)
Glucose-Capillary: 90 mg/dL (ref 70–99)
Glucose-Capillary: 79 mg/dL (ref 70–99)
Glucose-Capillary: 90 mg/dL (ref 70–99)

## 2022-10-11 MED ORDER — SENNOSIDES-DOCUSATE SODIUM 8.6-50 MG PO TABS
1.0000 | ORAL_TABLET | Freq: Every day | ORAL | Status: DC
  Administered 2022-10-11: 1 via ORAL
  Filled 2022-10-11: qty 1

## 2022-10-11 MED ORDER — POLYETHYLENE GLYCOL 3350 17 G PO PACK
17.0000 g | PACK | Freq: Every day | ORAL | Status: DC
  Administered 2022-10-11 – 2022-10-12 (×2): 17 g via ORAL
  Filled 2022-10-11 (×2): qty 1

## 2022-10-11 MED ORDER — LINACLOTIDE 145 MCG PO CAPS
290.0000 ug | ORAL_CAPSULE | Freq: Every day | ORAL | Status: DC
  Administered 2022-10-12: 290 ug via ORAL
  Filled 2022-10-11: qty 2

## 2022-10-11 NOTE — Progress Notes (Signed)
Last known BM 4/19, on assessment patient stated that she is passing gas, have not been eating much for the past couple of days due to nausea. Pt. Advised to ambulate but with staff supervision due to orthostatic hypotension. Encouraged to increase fluid as well.MD was notified, Will continue to monitor.

## 2022-10-11 NOTE — Progress Notes (Signed)
PROGRESS NOTE  Mary Wilson [place] ZOX:[id number] DOB: 1965/10/06 DOA: 10/07/2022 PCP: [name], NP  HPI/Recap of past 61 hours: 57 years old female with past medical history of gastroparesis, IBS with constipation, history of previous breast cancer, lymphoma, lupus, legal blindness, asthma, CVA and hypertension who presented to hospital with complaints of persistent nausea, vomiting and diarrhea X about 2 weeks. Patient was seen in the ED few days back when she was prescribed Reglan but despite that she was unable to keep anything down.  Patient states that she has not had the much to eat over the last 1 week or so.  Has hx of intermittent vomiting from gastroparesis.  Has been having dizziness on standing positions for the last 2 weeks with an near syncopal episode. Pt was seen at [facility] and was noted to have hypotension and orthostatic.  Patient denied any recent use of antibiotics or recent travels. No concern for food poisoning. In the ED, patient was afebrile but had significant orthostatic hypotension. Labs showed potassium low at 2.8.  Patient admitted for further management.    Patient reports feeling slightly better, denies any further significant nausea/vomiting, abdominal pain, diarrhea.  Still with some dizziness upon ambulation.  Denies any chest pain    Assessment/Plan: Principal Problem:   Orthostatic hypotension Active Problems:   History of breast cancer   Hyperlipidemia   History of Hodgkin's disease   Essential hypertension   BMI 40.0-44.9, adult (HCC)   Vomiting   Malnutrition of moderate degree   Intractable nausea vomiting, diarrhea ??History of gastroparesis Improving GI pathogen and C. difficile panel not collected  Continue IV fluids Continue Zofran Start reglan and monitor closely Continue PPI Advance diet  Orthostatic hypotension Improving, resolved Likely secondary to volume depletion from n/v with multiple antihypertensives at  home Continue IV fluid Monitor orthostatic blood pressure daily PT/OT  Hypokalemia Replace prn  History of diabetes mellitus type 2 Last hemoglobin A1c 5.1 Continue SSI, Accu-Cheks, hypoglycemic protocol  Essential hypertension Held home amlodipine, Lasix, losartan, valsartan HCTZ for now Continue metoprolol Adjust meds upon discharge  Hyperlipidemia Hold Lipitor for now   Right eye blindness status post prosthesis   Asthma Stable Resume inhaler   History of lupus Patient is on methotrexate, Plaquenil outpatient On multiple skin cream for lupus dermatitis     Estimated body mass index is 27 kg/m as calculated from the following:   Height as of 10/03/22: 5\' 7"  (1.702 m).   Weight as of this encounter: 78.2 kg.     Code Status: Full  Family Communication: None at bedside  Disposition Plan: Status is: Inpatient The patient will require care spanning > 2 midnights and should be moved to inpatient because: Level of care      Consultants: None  Procedures: None  Antimicrobials: None  DVT prophylaxis: Lovenox   Objective: Vitals:   10/11/22 0744 10/11/22 1216 10/11/22 1218 10/11/22 1219  BP:  (!) 122/93 (!) 118/90 107/82  Pulse:  81 85 96  Resp:  18 20 (!) 22  Temp:  97.9 F (36.6 C) 97.9 F (36.6 C) 98 F (36.7 C)  TempSrc:  Oral Oral Oral  SpO2: 99% 100% 100% 100%  Weight:        Intake/Output Summary (Last 24 hours) at 10/11/2022 1549 Last data filed at 10/10/2022 2236 Gross per 24 hour  Intake 2789.06 ml  Output --  Net 2789.06 ml   Filed Weights   10/09/22 1654 10/10/22 0500 10/11/22 0500  Weight: 75.5 kg 74.9 kg 78.2 kg    Exam: General: NAD  Cardiovascular: S1, S2 present Respiratory: CTAB Abdomen: Soft, nontender, nondistended, bowel sounds present Musculoskeletal: No bilateral pedal edema noted Skin: Normal Psychiatry: Normal mood     Data Reviewed: CBC: Recent Labs  Lab 10/07/22 1312 10/08/22 0059 10/09/22 0543  10/10/22 0657 10/11/22 0544  WBC 7.2 8.1 5.9 6.4 7.4  NEUTROABS  --   --  2.5 2.9 3.5  HGB 13.7 11.5* 10.8* 10.6* 10.8*  HCT 40.6 34.2* 32.3* 31.3* 32.4*  MCV 80.4 80.5 80.3 80.3 81.2  PLT 334 246 245 226 243   Basic Metabolic Panel: Recent Labs  Lab 10/07/22 1312 10/08/22 0059 10/09/22 0543 10/10/22 0657 10/11/22 0544  NA 131* 135 134* 136 135  K 2.8* 3.0* 4.4 3.8 3.6  CL 94* 102 105 106 103  CO2 25 22 21* 22 21*  GLUCOSE 106* 67* 90 79 89  BUN 7 7 <5* <5* 6  CREATININE 1.10* 0.96 0.92 0.72 0.95  CALCIUM 9.4 8.6* 8.7* 9.1 8.9  MG  --  1.7  --   --   --    GFR: Estimated Creatinine Clearance: 71.2 mL/min (by C-G formula based on SCr of 0.95 mg/dL). Liver Function Tests: Recent Labs  Lab 10/07/22 1312 10/08/22 0059  AST 14* 10*  ALT 9 8  ALKPHOS 67 48  BILITOT 0.8 1.0  PROT 7.8 6.0*  ALBUMIN 3.6 2.8*   Recent Labs  Lab 10/07/22 1312  LIPASE 22   No results for input(s): "AMMONIA" in the last 168 hours. Coagulation Profile: No results for input(s): "INR", "PROTIME" in the last 168 hours. Cardiac Enzymes: No results for input(s): "CKTOTAL", "CKMB", "CKMBINDEX", "TROPONINI" in the last 168 hours. BNP (last 3 results) No results for input(s): "PROBNP" in the last 8760 hours. HbA1C: No results for input(s): "HGBA1C" in the last 72 hours.  CBG: Recent Labs  Lab 10/10/22 1219 10/10/22 1639 10/10/22 2034 10/11/22 0727 10/11/22 1150  GLUCAP 100* 95 101* 95 90   Lipid Profile: No results for input(s): "CHOL", "HDL", "LDLCALC", "TRIG", "CHOLHDL", "LDLDIRECT" in the last 72 hours. Thyroid Function Tests: No results for input(s): "TSH", "T4TOTAL", "FREET4", "T3FREE", "THYROIDAB" in the last 72 hours.  Anemia Panel: No results for input(s): "VITAMINB12", "FOLATE", "FERRITIN", "TIBC", "IRON", "RETICCTPCT" in the last 72 hours. Urine analysis:    Component Value Date/Time   COLORURINE AMBER (A) 10/07/2022 1312   APPEARANCEUR HAZY (A) 10/07/2022 1312    LABSPEC 1.030 10/07/2022 1312   PHURINE 5.0 10/07/2022 1312   GLUCOSEU NEGATIVE 10/07/2022 1312   HGBUR NEGATIVE 10/07/2022 1312   BILIRUBINUR MODERATE (A) 10/07/2022 1312   KETONESUR 20 (A) 10/07/2022 1312   PROTEINUR 100 (A) 10/07/2022 1312   UROBILINOGEN 1.0 11/04/2007 0833   NITRITE NEGATIVE 10/07/2022 1312   LEUKOCYTESUR NEGATIVE 10/07/2022 1312   Sepsis Labs: @LABRCNTIP (procalcitonin:4,lacticidven:4)  )No results found for this or any previous visit (from the past 240 hour(s)).    Studies: No results found.  Scheduled Meds:  amitriptyline  150 mg Oral QHS   enoxaparin (LOVENOX) injection  40 mg Subcutaneous Q24H   feeding supplement  237 mL Oral BID BM   folic acid  1 mg Oral Daily   hydroxychloroquine  200 mg Oral Daily   insulin aspart  0-9 Units Subcutaneous TID WC   loratadine  10 mg Oral Daily   metoCLOPramide (REGLAN) injection  10 mg Intravenous Q8H   metoprolol succinate  100 mg Oral Daily  mometasone-formoterol  2 puff Inhalation BID   multivitamin with minerals  1 tablet Oral Daily   nicotine  14 mg Transdermal Daily   pantoprazole (PROTONIX) IV  40 mg Intravenous Q12H   tiZANidine  2 mg Oral TID   topiramate  50 mg Oral Daily   traZODone  300 mg Oral QHS   umeclidinium-vilanterol  1 puff Inhalation Daily   valACYclovir  1,000 mg Oral Daily   vitamin E  400 Units Oral Daily    Continuous Infusions:  sodium chloride 100 mL/hr at 10/11/22 0858     LOS: 3 days     Briant Cedar, MD [facility]  If 7PM-7AM, please contact night-coverage [email or web address] 10/11/2022, 3:49 PM

## 2022-10-11 NOTE — Evaluation (Signed)
Occupational Therapy Evaluation Patient Details Name: Mary Wilson MRN: [id number] DOB: 1965-09-04 Today's Date: 10/11/2022   History of Present Illness Patient is a 57 year old female who presented with 2 week history of nausea, vomiting, and diarrhea. Patient was admitted with orthostatic hypotension, hypokalemia, PMH: DM II, HTN, hyperlipidemia, blindness in R eye with prosthesis, asthma.   Clinical Impression   Patient is a 57 year old female who was admitted for above. Patient was living at home with wife while working from home for [employer]. Patient was noted to have increased dizziness with transitions. Patient reported she was not yet at her baseline for ADLs. Patient was noted to have decreased functional activity tolerance, decreased endurance, decreased standing balance, decreased safety awareness, and decreased knowledge of AD/AE impacting participation in ADLs. Patient would continue to benefit from skilled OT services at this time while admitted and after d/c to address noted deficits in order to improve overall safety and independence in ADLs.     Blood pressures Supine 119/90 mmhg HR 82 bpm  Sitting: 99/84 mmhg HR 93 bpm  Standing 96/80 HR 98 bpm  Standing 3 mins: 103/76 mmhg HR 116 bpm     Recommendations for follow up therapy are one component of a multi-disciplinary discharge planning process, led by the attending physician.  Recommendations may be updated based on patient status, additional functional criteria and insurance authorization.   Assistance Recommended at Discharge Frequent or constant Supervision/Assistance  Patient can return home with the following A little help with walking and/or transfers;A little help with bathing/dressing/bathroom;Assistance with cooking/housework;Assist for transportation;Help with stairs or ramp for entrance    Functional Status Assessment  Patient has had a recent decline in their functional status and  demonstrates the ability to make significant improvements in function in a reasonable and predictable amount of time.  Equipment Recommendations  Other (comment) (RW)       Precautions / Restrictions Precautions Precautions: Fall Precaution Comments: orthostatic Restrictions Weight Bearing Restrictions: No      Mobility Bed Mobility Overal bed mobility: Modified Independent             General bed mobility comments: with HOB rasied slightly.                    Balance Overall balance assessment: Mild deficits observed, not formally tested                   ADL either performed or assessed with clinical judgement   ADL Overall ADL's : Needs assistance/impaired Eating/Feeding: Modified independent;Sitting   Grooming: Oral care;Standing;Min guard Grooming Details (indicate cue type and reason): with no LOB but noted to have poor awareness of chair behind her even with education on its location with patient needing physical A to land into recliner safely and not plop. Upper Body Bathing: Set up;Sitting   Lower Body Bathing: Min guard;Sit to/from stand;Sitting/lateral leans   Upper Body Dressing : Set up;Sitting   Lower Body Dressing: Min guard;Sit to/from stand;Sitting/lateral leans   Toilet Transfer: Min guard;Ambulation;Rolling walker (2 wheels) Toilet Transfer Details (indicate cue type and reason): with increased time and patient reporting " i am praying" during walking. patient reported extra prayers than normal when further asked but would not elaborate on if there was a change in symptoms. Toileting- Architect and Hygiene: Min guard;Sit to/from stand Toileting - Clothing Manipulation Details (indicate cue type and reason): with RW       General ADL Comments:  patient reported haivng increased difficulty with getting from living area to back door to let dogs in and out. patient was educated on having chairs strategically placed in house  for rest breaks. patient reported her wife had already done this for her. patient was also educaetd on using reacher to reduce need to bend over. patient verbalized understanding reporting she has one at home already.     Vision Baseline Vision/History: 2 Legally blind (R eye with prostesis in place) Additional Comments: patient reported having 20/80 vision in L eye with no contact in place.            Pertinent Vitals/Pain Pain Assessment Pain Assessment: No/denies pain     Hand Dominance Right   Extremity/Trunk Assessment Upper Extremity Assessment Upper Extremity Assessment: Overall WFL for tasks assessed   Lower Extremity Assessment Lower Extremity Assessment: Defer to PT evaluation   Cervical / Trunk Assessment Cervical / Trunk Assessment: Normal   Communication Communication Communication: No difficulties   Cognition Arousal/Alertness: Awake/alert Behavior During Therapy: WFL for tasks assessed/performed Overall Cognitive Status: Within Functional Limits for tasks assessed                 General Comments: very plesant and cooperative.                Home Living Family/patient expects to be discharged to:: Private residence Living Arrangements: Spouse/significant other Available Help at Discharge: Family Type of Home: House Home Access: Stairs to enter Secretary/administrator of Steps: 7   Home Layout: One level               Home Equipment: None          Prior Functioning/Environment Prior Level of Function : Independent/Modified Independent               ADLs Comments: did endorse having dizzines limiting ammount of participation in tasks        OT Problem List: Decreased activity tolerance;Impaired balance (sitting and/or standing);Decreased coordination;Decreased safety awareness;Decreased knowledge of precautions;Decreased knowledge of use of DME or AE      OT Treatment/Interventions: Self-care/ADL training;Energy  conservation;Therapeutic activities;Patient/family education;DME and/or AE instruction;Therapeutic exercise;Balance training    OT Goals(Current goals can be found in the care plan section) Acute Rehab OT Goals Patient Stated Goal: tto get better OT Goal Formulation: With patient Time For Goal Achievement: 10/25/22 Potential to Achieve Goals: Fair  OT Frequency: Min 2X/week       AM-PAC OT "6 Clicks" Daily Activity     Outcome Measure Help from another person eating meals?: None Help from another person taking care of personal grooming?: A Little Help from another person toileting, which includes using toliet, bedpan, or urinal?: A Little Help from another person bathing (including washing, rinsing, drying)?: A Little Help from another person to put on and taking off regular upper body clothing?: A Little Help from another person to put on and taking off regular lower body clothing?: A Little 6 Click Score: 19   End of Session Equipment Utilized During Treatment: Rolling walker (2 wheels) Nurse Communication: Other (comment) (patients vitals that were added to flowsheet)  Activity Tolerance: Patient tolerated treatment well Patient left: in chair;with call bell/phone within reach;with chair alarm set  OT Visit Diagnosis: Unsteadiness on feet (R26.81);Muscle weakness (generalized) (M62.81);Other abnormalities of gait and mobility (R26.89)                Time: 9629-5284 OT Time Calculation (min): 25 min Charges:  OT  General Charges $OT Visit: 1 Visit OT Evaluation $OT Eval Low Complexity: 1 Low OT Treatments $Self Care/Home Management : 8-22 mins  Rosalio Loud, MS Acute Rehabilitation Department Office# [phone]   Selinda Flavin 10/11/2022, 1:10 PM

## 2022-10-11 NOTE — Evaluation (Signed)
Physical Therapy Evaluation Patient Details Name: Mary Wilson MRN: [id number] DOB: [birth year] Today's Date: 10/11/2022  History of Present Illness  Patient is a 57 year old female who presented with 2 week history of nausea, vomiting, and diarrhea. Patient was admitted with orthostatic hypotension, hypokalemia, PMH: DM II, HTN, hyperlipidemia, blindness in R eye with prosthesis, asthma.  Clinical Impression  Pt admitted with above diagnosis.  Pt able to amb ~ 46' x2 with RW, c/o some lightheadedness before, during and after amb although did not feel like she would "pass out"; reports feeling tired with amb.  BP during seated rest 129/88  BPs as follows -  BP lying 125/88 (MAP 89) HR 85 BP sitting  114/95 (MAP 103) HR 91 BP standing at 1 minute 112/92 (MAP 101) HR 101   Pt currently with functional limitations due to the deficits listed below (see PT Problem List). Pt will benefit from acute skilled PT to increase their independence and safety with mobility to allow discharge.          Recommendations for follow up therapy are one component of a multi-disciplinary discharge planning process, led by the attending physician.  Recommendations may be updated based on patient status, additional functional criteria and insurance authorization.  Follow Up Recommendations       Assistance Recommended at Discharge PRN  Patient can return home with the following  Assist for transportation;Help with stairs or ramp for entrance;Assistance with cooking/housework    Equipment Recommendations Other (comment) (requests rollator)  Recommendations for Other Services       Functional Status Assessment Patient has had a recent decline in their functional status and demonstrates the ability to make significant improvements in function in a reasonable and predictable amount of time.     Precautions / Restrictions Precautions Precautions: Fall Precaution Comments: "lightheaded" not truely  orthostatic at time of PT eval per BPs Restrictions Weight Bearing Restrictions: No      Mobility  Bed Mobility Overal bed mobility: Modified Independent             General bed mobility comments: no physical assist    Transfers Overall transfer level: Needs assistance Equipment used: Rolling walker (2 wheels) Transfers: Sit to/from Stand Sit to Stand: Min assist, Min guard           General transfer comment: for safety, hands on assist d/t pt c/o "lightheadedness"    Ambulation/Gait Ambulation/Gait assistance: Min guard, Min assist, +2 safety/equipment (chair follow for safety) Gait Distance (Feet): 60 Feet (x2) Assistive device: Rolling walker (2 wheels) Gait Pattern/deviations: Step-through pattern       General Gait Details: cues for RW position and  trunk extension as able  Stairs            Wheelchair Mobility    Modified Rankin (Stroke Patients Only)       Balance Overall balance assessment: Mild deficits observed, not formally tested                                           Pertinent Vitals/Pain Pain Assessment Pain Assessment: No/denies pain    Home Living Family/patient expects to be discharged to:: Private residence Living Arrangements: Spouse/significant other Available Help at Discharge: Family Type of Home: House Home Access: Stairs to enter   Secretary/administrator of Steps: 7   Home Layout: One level Home Equipment: None  Prior Function Prior Level of Function : Independent/Modified Independent             Mobility Comments: pt endorses Independence at home ADLs Comments: did endorse having dizzines limiting ammount of participation in tasks     Hand Dominance   Dominant Hand: Right    Extremity/Trunk Assessment   Upper Extremity Assessment Upper Extremity Assessment: Defer to OT evaluation;Overall [facility] for tasks assessed    Lower Extremity Assessment Lower Extremity Assessment:  Overall WFL for tasks assessed    Cervical / Trunk Assessment Cervical / Trunk Assessment: Normal  Communication   Communication: No difficulties  Cognition Arousal/Alertness: Awake/alert Behavior During Therapy: WFL for tasks assessed/performed Overall Cognitive Status: Within Functional Limits for tasks assessed                                 General Comments: very plesant and cooperative.        General Comments      Exercises     Assessment/Plan    PT Assessment Patient needs continued PT services  PT Problem List Decreased activity tolerance;Decreased mobility       PT Treatment Interventions DME instruction;Gait training;Functional mobility training;Therapeutic activities;Patient/family education;Therapeutic exercise    PT Goals (Current goals can be found in the Care Plan section)  Acute Rehab PT Goals Patient Stated Goal: feel better PT Goal Formulation: With patient Time For Goal Achievement: 10/25/22 Potential to Achieve Goals: Good    Frequency Min 1X/week     Co-evaluation               AM-PAC PT "6 Clicks" Mobility  Outcome Measure Help needed turning from your back to your side while in a flat bed without using bedrails?: None Help needed moving from lying on your back to sitting on the side of a flat bed without using bedrails?: None Help needed moving to and from a bed to a chair (including a wheelchair)?: A Little Help needed standing up from a chair using your arms (e.g., wheelchair or bedside chair)?: A Little Help needed to walk in hospital room?: A Little Help needed climbing 3-5 steps with a railing? : A Little 6 Click Score: 20    End of Session Equipment Utilized During Treatment: Gait belt Activity Tolerance: Patient tolerated treatment well Patient left: in bed;with call bell/phone within reach;with bed alarm set   PT Visit Diagnosis: Other abnormalities of gait and mobility (R26.89)    Time: 1610-9604 PT  Time Calculation (min) (ACUTE ONLY): 20 min   Charges:   PT Evaluation $PT Eval Low Complexity: 1 Low          Ilianna Bown, PT  Acute Rehab Dept [facility]) [phone]  10/11/2022   [facility] 10/11/2022, 3:36 PM

## 2022-10-12 LAB — GLUCOSE, CAPILLARY
Glucose-Capillary: 89 mg/dL (ref 70–99)
Glucose-Capillary: 100 mg/dL — ABNORMAL HIGH (ref 70–99)

## 2022-10-12 MED ORDER — METOCLOPRAMIDE HCL 10 MG PO TABS: 10.0000 mg | ORAL_TABLET | Freq: Three times a day (TID) | ORAL | 0 refills | Status: AC

## 2022-10-12 MED ORDER — ONDANSETRON 4 MG PO TBDP: 4.0000 mg | ORAL_TABLET | Freq: Three times a day (TID) | ORAL | 0 refills | Status: AC | PRN

## 2022-10-12 MED ORDER — FUROSEMIDE 20 MG PO TABS: 20.0000 mg | ORAL_TABLET | Freq: Every day | ORAL | Status: AC | PRN

## 2022-10-12 NOTE — Plan of Care (Signed)

## 2022-10-12 NOTE — Discharge Summary (Signed)
Physician Discharge Summary   Patient: Mary Wilson MRN: [id number] DOB: [birth year]  Admit date:     10/07/2022  Discharge date: 10/12/22  Discharge Physician: Briant Cedar   PCP: [name], NP   Recommendations at discharge:   Patient requesting to establish care with a new PCP as she has been following [facility] clinic.  Advised her to establish care with [facility] physicians and then can be referred to their GI department. She can also look for physicians in the [place] system   Discharge Diagnoses: Principal Problem:   Orthostatic hypotension Active Problems:   History of breast cancer   Hyperlipidemia   History of Hodgkin's disease   Essential hypertension   BMI 40.0-44.9, adult (HCC)   Vomiting   Malnutrition of moderate degree    Hospital Course: 57 years old female with past medical history of gastroparesis, IBS with constipation, history of previous breast cancer, lymphoma, lupus, legal blindness, asthma, CVA and hypertension who presented to hospital with complaints of persistent nausea, vomiting and diarrhea X about 2 weeks. Patient was seen in the ED few days back when she was prescribed Reglan but despite that she was unable to keep anything down.  Patient states that she has not had the much to eat over the last 1 week or so.  Has hx of intermittent vomiting from gastroparesis.  Has been having dizziness on standing positions for the last 2 weeks with an near syncopal episode. Pt was seen at [facility] and was noted to have hypotension and orthostatic.  Patient denied any recent use of antibiotics or recent travels. No concern for food poisoning. In the ED, patient was afebrile but had significant orthostatic hypotension. Labs showed potassium low at 2.8.  Patient admitted for further management.     Today, patient denies any new complaints.  Advised to establish care with primary care physician as well as gastroenterologist.  Advised to be  compliant with metoclopramide for now, small frequent meals, stay hydrated and ambulate. Ordered a rollator with seat for patient     Assessment and Plan: Intractable nausea vomiting, diarrhea ??History of gastroparesis Improving GI pathogen and C. difficile panel not collected, further diarrhea S/p IV fluids Continue Zofran prn, reglan and monitor closely Continue PPI Advance diet (small, frequent meals)   Orthostatic hypotension Improving, resolved Likely secondary to volume depletion from n/v with multiple antihypertensives at home S/p IV fluid   Hypokalemia Replaced prn   History of diabetes mellitus type 2 Last hemoglobin A1c 5.1 Continue home regimen   Essential hypertension Held home Lasix, prn for now Continue metoprolol   Hyperlipidemia   Right eye blindness status post prosthesis   Asthma Stable Resume inhaler   History of lupus Patient is on methotrexate, Plaquenil outpatient On multiple skin cream for lupus dermatitis           Consultants: None Procedures performed: None Disposition: Home Diet recommendation:  Cardiac and Carb modified diet   DISCHARGE MEDICATION: Allergies as of 10/12/2022       Reactions   Erythromycin Nausea And Vomiting, Shortness Of Breath   Penicillins Anaphylaxis   Sulfa Antibiotics Anaphylaxis, Nausea And Vomiting   Morphine Other (See Comments)   insomnia   Tetracyclines & Related Nausea And Vomiting   Morphine And Related Other (See Comments)   Stop breathing        Medication List     STOP taking these medications    alclomethasone 0.05 % cream Commonly known as: ACLOVATE  clobetasol 0.05 % topical foam Commonly known as: OLUX   mupirocin ointment 2 % Commonly known as: BACTROBAN       TAKE these medications    Acetaminophen 500 MG capsule Take 1 capsule by mouth every 6 (six) hours as needed for pain or fever.   albuterol 108 (90 Base) MCG/ACT inhaler Commonly known as: VENTOLIN  HFA Inhale 1 puff into the lungs every 6 (six) hours as needed for shortness of breath.   amitriptyline 150 MG tablet Commonly known as: ELAVIL Take 150 mg by mouth at bedtime.   cyanocobalamin 1000 MCG tablet Commonly known as: VITAMIN B12 Take 1,000 mcg by mouth daily.   Dapsone 7.5 % Gel Apply 1 application. topically at bedtime.   Dexilant 60 MG capsule Generic drug: dexlansoprazole Take 1 capsule by mouth daily.   DULoxetine 60 MG capsule Commonly known as: CYMBALTA Take 120 mg by mouth daily.   ergocalciferol 1.25 MG (50000 UT) capsule Commonly known as: VITAMIN D2 Take 50,000 Units by mouth once a week.   esomeprazole 40 MG capsule Commonly known as: NEXIUM Take 40 mg by mouth daily before breakfast.   famotidine 20 MG tablet Commonly known as: PEPCID Take 20 mg by mouth daily as needed for heartburn.   Fluocinolone Acetonide Body 0.01 % Oil Commonly known as: Derma-Smoothe/FS Body Apply to affected area qd   folic acid 800 MCG tablet Commonly known as: FOLVITE Take by mouth.   furosemide 20 MG tablet Commonly known as: LASIX Take 20 mg by mouth daily.   hydroxychloroquine 200 MG tablet Commonly known as: PLAQUENIL Take by mouth.   ibuprofen 800 MG tablet Commonly known as: ADVIL Take 800 mg by mouth every 8 (eight) hours as needed for mild pain.   lactulose 10 GM/15ML solution Commonly known as: CHRONULAC Take 10 g by mouth daily.   Linzess 290 MCG Caps capsule Generic drug: linaclotide Take 290 mcg by mouth daily.   loratadine 10 MG tablet Commonly known as: CLARITIN Take 10 mg by mouth daily.   metoCLOPramide 10 MG tablet Commonly known as: REGLAN Take 1 tablet (10 mg total) by mouth 3 (three) times daily with meals for 14 days. What changed: Another medication with the same name was removed. Continue taking this medication, and follow the directions you see here.   metoprolol succinate 100 MG 24 hr tablet Commonly known as:  TOPROL-XL Take 100 mg by mouth daily. Take with or immediately following a meal.   ondansetron 4 MG disintegrating tablet Commonly known as: ZOFRAN-ODT Take 1 tablet (4 mg total) by mouth every 8 (eight) hours as needed for nausea or vomiting.   oxyCODONE-acetaminophen 5-325 MG tablet Commonly known as: PERCOCET/ROXICET Take 1 tablet by mouth every 6 (six) hours as needed for moderate pain.   polyethylene glycol powder 17 GM/SCOOP powder Commonly known as: GLYCOLAX/MIRALAX Take 0.5 Containers by mouth daily as needed for mild constipation.   potassium chloride 10 MEQ tablet Commonly known as: KLOR-CON Take 10 mEq by mouth daily.   Symbicort 80-4.5 MCG/ACT inhaler Generic drug: budesonide-formoterol Inhale 2 puffs into the lungs daily.   tizanidine 2 MG capsule Commonly known as: Zanaflex Take 1 capsule (2 mg total) by mouth 3 (three) times daily.   topiramate 50 MG tablet Commonly known as: TOPAMAX Take 50 mg by mouth daily.   traZODone 150 MG tablet Commonly known as: DESYREL Take 300 mg by mouth at bedtime.   umeclidinium-vilanterol 62.5-25 MCG/INH Aepb Commonly known as: ANORO ELLIPTA Inhale  1 puff into the lungs daily.   valACYclovir 1000 MG tablet Commonly known as: VALTREX Take 1,000 mg by mouth daily.   Vitamin E 400 units Tabs Take 1 tablet by mouth daily.               Durable Medical Equipment  (From admission, onward)           Start     Ordered   10/12/22 1105  For home use only DME 4 wheeled rolling walker with seat  Once       Question:  Patient needs a walker to treat with the following condition  Answer:  Orthostatic hypotension   10/12/22 1104            Follow-up Information     [place] [place] Follow up.   Specialty: Family Medicine Why: Call to establish care for a new primary care physician. They have several locations, ask for the one more convinient to you. They are a big group that also have  gastroenterologist that you can be referred to Contact information: [address] [place] [address] [phone]                Discharge Exam: Filed Weights   10/10/22 0500 10/11/22 0500 10/11/22 2300  Weight: 74.9 kg 78.2 kg 78.2 kg   General: NAD  Cardiovascular: S1, S2 present Respiratory: CTAB Abdomen: Soft, nontender, nondistended, bowel sounds present Musculoskeletal: No bilateral pedal edema noted Skin: Normal Psychiatry: Normal mood   Condition at discharge: stable  The results of significant diagnostics from this hospitalization (including imaging, microbiology, ancillary and laboratory) are listed below for reference.   Imaging Studies: DG Abdomen 1 View  Result Date: 10/07/2022 CLINICAL DATA:  Pain.  Emesis EXAM: ABDOMEN - 1 VIEW COMPARISON:  None Available. FINDINGS: Battery pack along the upper right hemipelvis with a stimulator lead along the right hemipelvis. Scattered surgical clips. Scattered degenerative changes along the spine. Gas seen along nondilated loops of small and large bowel. Scattered stool. No obstruction. Gas and stool along the rectum. No obvious free air on this supine radiograph. IMPRESSION: Nonspecific bowel gas pattern with some scattered gas and stool. Surgical changes.  Sacral stimulator. Electronically Signed   By: Karen Kays M.D.   On: 10/07/2022 17:15   CT ABDOMEN PELVIS W CONTRAST  Addendum Date: 10/06/2022   ADDENDUM REPORT: 10/06/2022 17:08 ADDENDUM: There is a typographical error in the findings of the report. Please see corrected section: Vascular/lymphatic: Normal caliber aorta and IVC with some scattered vascular calcifications. Retroaortic left renal vein. No specific abnormal lymph node enlargement seen in the abdomen and pelvis Electronically Signed   By: Karen Kays M.D.   On: 10/06/2022 17:08   Result Date: 10/06/2022 CLINICAL DATA:  Nonlocalized abdominal pain.  Nausea and vomiting EXAM: CT ABDOMEN AND PELVIS  WITH CONTRAST TECHNIQUE: Multidetector CT imaging of the abdomen and pelvis was performed using the standard protocol following bolus administration of intravenous contrast. RADIATION DOSE REDUCTION: This exam was performed according to the departmental dose-optimization program which includes automated exposure control, adjustment of the mA and/or kV according to patient size and/or use of iterative reconstruction technique. CONTRAST:  OMNIPAQUE IOHEXOL 300 MG/ML  SOLN COMPARISON:  Renal ultrasound 08/28/2020. Hip CT scan right 07/04/2021 FINDINGS: Lower chest: Mild linear opacity lung bases likely scar or atelectasis. No pleural effusion. Small pericardial effusion. Hepatobiliary: Focal fat deposition seen in the liver adjacent to the falciform ligament in the  medial segment. Patent portal vein. Gallbladder is nondilated. Pancreas: Unremarkable. No pancreatic ductal dilatation or surrounding inflammatory changes. Spleen: The spleen is nonenlarged. Small splenule. Preserved enhancement. Adrenals/Urinary Tract: The adrenal glands are preserved. No collecting system dilatation. No enhancing renal mass. There are some tiny low-attenuation lesion seen along each kidney, under 5 mm. These too small to completely characterize but likely small cysts and no specific imaging follow-up. Bosniak 2. The ureters have normal course and caliber down to the bladder. Contracted urinary bladder. Stomach/Bowel: Large bowel has a normal course and caliber. Mild scattered colonic stool. Stomach and small bowel are nondilated as well. Normal appendix seen extending posterior to the cecum in the right hemipelvis. Example coronal series 5, image 50 Vascular/Lymphatic: Normal caliber aorta and IVC with some scattered vascular calcifications. Retroaortic left renal vein. Specific abnormal lymph node enlargement seen in the abdomen and pelvis. Reproductive: Status post hysterectomy. No adnexal masses. Other: Surgical clips along the  anterior pelvis. No free air or free fluid. There is a battery pack posterior to the right hemipelvis with leads extending through the right hemi sacrum into the low pelvis. Please correlate for stimulator device. Musculoskeletal: Mild degenerative changes seen along the spine. Mild degenerative changes of the pelvis. IMPRESSION: No bowel obstruction, free air or free fluid.  Normal appendix. Stimulator device along the right hemipelvis and sacrum. Electronically Signed: By: Karen Kays M.D. On: 10/03/2022 21:15    Microbiology: No results found for this or any previous visit.  Labs: CBC: Recent Labs  Lab 10/07/22 1312 10/08/22 0059 10/09/22 0543 10/10/22 0657 10/11/22 0544  WBC 7.2 8.1 5.9 6.4 7.4  NEUTROABS  --   --  2.5 2.9 3.5  HGB 13.7 11.5* 10.8* 10.6* 10.8*  HCT 40.6 34.2* 32.3* 31.3* 32.4*  MCV 80.4 80.5 80.3 80.3 81.2  PLT 334 246 245 226 243   Basic Metabolic Panel: Recent Labs  Lab 10/07/22 1312 10/08/22 0059 10/09/22 0543 10/10/22 0657 10/11/22 0544  NA 131* 135 134* 136 135  K 2.8* 3.0* 4.4 3.8 3.6  CL 94* 102 105 106 103  CO2 25 22 21* 22 21*  GLUCOSE 106* 67* 90 79 89  BUN 7 7 <5* <5* 6  CREATININE 1.10* 0.96 0.92 0.72 0.95  CALCIUM 9.4 8.6* 8.7* 9.1 8.9  MG  --  1.7  --   --   --    Liver Function Tests: Recent Labs  Lab 10/07/22 1312 10/08/22 0059  AST 14* 10*  ALT 9 8  ALKPHOS 67 48  BILITOT 0.8 1.0  PROT 7.8 6.0*  ALBUMIN 3.6 2.8*   CBG: Recent Labs  Lab 10/11/22 0727 10/11/22 1150 10/11/22 1650 10/11/22 2039 10/12/22 0738  GLUCAP 95 90 79 90 89    Discharge time spent: greater than 30 minutes.  Signed: Briant Cedar, MD [facility] 10/12/2022

## 2022-10-12 NOTE — TOC Transition Note (Signed)
Transition of Care [facility]) - CM/SW Discharge Note   Patient Details  Name: Mary Wilson MRN: [id number] Date of Birth: Oct 10, 1965  Transition of Care [facility] CM/SW Contact:  Otelia Santee, LCSW Phone Number: 10/12/2022, 11:21 AM   Clinical Narrative:    Met with pt and confirmed need for rollator. Pt denies having RW or other DME at home. Rollator ordered through [employer] and will be delivered to pt's room prior to discharge.  CSW encouraged pt to call insurance to find new PCP in network.    Final next level of care: Home/Self Care Barriers to Discharge: No Barriers Identified   Patient Goals and CMS Choice CMS Medicare.gov Compare Post Acute Care list provided to:: Patient Choice offered to / list presented to : Patient  Discharge Placement                         Discharge Plan and Services Additional resources added to the After Visit Summary for                  DME Arranged: Walker rolling with seat DME Agency: [employer] Date DME Agency Contacted: 10/12/22 Time DME Agency Contacted: 1121 Representative spoke with at DME Agency: Vaughan Basta            Social Determinants of Health (SDOH) Interventions SDOH Screenings   Food Insecurity: No Food Insecurity (10/08/2022)  Housing: Low Risk  (10/08/2022)  Transportation Needs: No Transportation Needs (10/08/2022)  Utilities: Not At Risk (10/08/2022)  Tobacco Use: Medium Risk (10/11/2022)     Readmission Risk Interventions    10/12/2022   11:21 AM  Readmission Risk Prevention Plan  Post Dischage Appt Complete  Medication Screening Complete  Transportation Screening Complete

## 2022-10-14 ENCOUNTER — Other Ambulatory Visit (HOSPITAL_COMMUNITY): Payer: Self-pay
